# Patient Record
Sex: Female | Born: 1935 | Race: White | Hispanic: No | Smoking: Never smoker
Health system: Southern US, Community
[De-identification: ages and names within clinical notes are randomized; demographics above are authoritative.]

## PROBLEM LIST (undated history)

## (undated) DIAGNOSIS — E039 Hypothyroidism, unspecified: Secondary | ICD-10-CM

## (undated) DIAGNOSIS — R1310 Dysphagia, unspecified: Secondary | ICD-10-CM

## (undated) DIAGNOSIS — S7291XA Unspecified fracture of right femur, initial encounter for closed fracture: Secondary | ICD-10-CM

## (undated) DIAGNOSIS — D649 Anemia, unspecified: Secondary | ICD-10-CM

## (undated) DIAGNOSIS — R110 Nausea: Secondary | ICD-10-CM

## (undated) DIAGNOSIS — E079 Disorder of thyroid, unspecified: Secondary | ICD-10-CM

## (undated) DIAGNOSIS — N189 Chronic kidney disease, unspecified: Secondary | ICD-10-CM

## (undated) DIAGNOSIS — F419 Anxiety disorder, unspecified: Secondary | ICD-10-CM

## (undated) DIAGNOSIS — Z96642 Presence of left artificial hip joint: Secondary | ICD-10-CM

## (undated) DIAGNOSIS — R278 Other lack of coordination: Secondary | ICD-10-CM

## (undated) DIAGNOSIS — F0390 Unspecified dementia without behavioral disturbance: Secondary | ICD-10-CM

## (undated) DIAGNOSIS — R262 Difficulty in walking, not elsewhere classified: Secondary | ICD-10-CM

## (undated) DIAGNOSIS — E7800 Pure hypercholesterolemia, unspecified: Secondary | ICD-10-CM

## (undated) DIAGNOSIS — Z9181 History of falling: Secondary | ICD-10-CM

## (undated) DIAGNOSIS — I10 Essential (primary) hypertension: Secondary | ICD-10-CM

## (undated) DIAGNOSIS — F329 Major depressive disorder, single episode, unspecified: Secondary | ICD-10-CM

## (undated) DIAGNOSIS — R41841 Cognitive communication deficit: Secondary | ICD-10-CM

## (undated) DIAGNOSIS — M6281 Muscle weakness (generalized): Secondary | ICD-10-CM

## (undated) DIAGNOSIS — F32A Depression, unspecified: Secondary | ICD-10-CM

## (undated) HISTORY — PX: HIP SURGERY: SHX245

---

## 2008-08-10 ENCOUNTER — Emergency Department (HOSPITAL_BASED_OUTPATIENT_CLINIC_OR_DEPARTMENT_OTHER): Admission: EM | Admit: 2008-08-10 | Discharge: 2008-08-10 | Payer: Self-pay | Admitting: Emergency Medicine

## 2010-02-03 ENCOUNTER — Inpatient Hospital Stay (HOSPITAL_COMMUNITY): Admission: EM | Admit: 2010-02-03 | Discharge: 2010-02-09 | Payer: Self-pay | Admitting: Emergency Medicine

## 2010-12-06 LAB — DIFFERENTIAL
Basophils Absolute: 0 10*3/uL (ref 0.0–0.1)
Eosinophils Absolute: 0 10*3/uL (ref 0.0–0.7)
Eosinophils Relative: 0 % (ref 0–5)
Lymphs Abs: 0.8 10*3/uL (ref 0.7–4.0)
Monocytes Absolute: 0.6 10*3/uL (ref 0.1–1.0)
Monocytes Absolute: 0.7 10*3/uL (ref 0.1–1.0)
Monocytes Relative: 8 % (ref 3–12)
Neutro Abs: 6.8 10*3/uL (ref 1.7–7.7)
Neutrophils Relative %: 81 % — ABNORMAL HIGH (ref 43–77)
Neutrophils Relative %: 89 % — ABNORMAL HIGH (ref 43–77)

## 2010-12-06 LAB — BASIC METABOLIC PANEL
BUN: 14 mg/dL (ref 6–23)
CO2: 25 mEq/L (ref 19–32)
CO2: 26 mEq/L (ref 19–32)
CO2: 27 mEq/L (ref 19–32)
Calcium: 8.7 mg/dL (ref 8.4–10.5)
Calcium: 9.2 mg/dL (ref 8.4–10.5)
Chloride: 100 mEq/L (ref 96–112)
Chloride: 97 mEq/L (ref 96–112)
Creatinine, Ser: 1.05 mg/dL (ref 0.4–1.2)
GFR calc Af Amer: 50 mL/min — ABNORMAL LOW (ref >60)
GFR calc Af Amer: >60 mL/min (ref >60)
GFR calc non Af Amer: 42 mL/min — ABNORMAL LOW (ref >60)
GFR calc non Af Amer: 45 mL/min — ABNORMAL LOW (ref >60)
GFR calc non Af Amer: 50 mL/min — ABNORMAL LOW (ref >60)
GFR calc non Af Amer: 53 mL/min — ABNORMAL LOW (ref >60)
Glucose, Bld: 126 mg/dL — ABNORMAL HIGH (ref 70–99)
Glucose, Bld: 130 mg/dL — ABNORMAL HIGH (ref 70–99)
Potassium: 3.4 mEq/L — ABNORMAL LOW (ref 3.5–5.1)
Potassium: 3.7 mEq/L (ref 3.5–5.1)
Potassium: 3.8 mEq/L (ref 3.5–5.1)
Sodium: 131 mEq/L — ABNORMAL LOW (ref 135–145)
Sodium: 135 mEq/L (ref 135–145)
Sodium: 135 mEq/L (ref 135–145)
Sodium: 135 mEq/L (ref 135–145)

## 2010-12-06 LAB — CBC
HCT: 36.3 % (ref 36.0–46.0)
MCHC: 35 g/dL (ref 30.0–36.0)
Platelets: 162 10*3/uL (ref 150–400)
Platelets: 193 10*3/uL (ref 150–400)
RBC: 3.73 MIL/uL — ABNORMAL LOW (ref 3.87–5.11)
RBC: 4.02 MIL/uL (ref 3.87–5.11)
RBC: 4.35 MIL/uL (ref 3.87–5.11)
RDW: 13.3 % (ref 11.5–15.5)
WBC: 6.9 10*3/uL (ref 4.0–10.5)

## 2010-12-06 LAB — PROTIME-INR
INR: 1.06 (ref 0.00–1.49)
Prothrombin Time: 13.7 seconds (ref 11.6–15.2)

## 2010-12-06 LAB — URINALYSIS, ROUTINE W REFLEX MICROSCOPIC
Bilirubin Urine: NEGATIVE
Glucose, UA: NEGATIVE mg/dL
Hgb urine dipstick: NEGATIVE

## 2010-12-06 LAB — TYPE AND SCREEN: ABO/RH(D): O POS

## 2010-12-06 LAB — APTT
aPTT: 30 seconds (ref 24–37)
aPTT: 31 seconds (ref 24–37)

## 2010-12-06 LAB — LIPID PANEL
Cholesterol: 205 mg/dL — ABNORMAL HIGH (ref 0–200)
Total CHOL/HDL Ratio: 4.8 RATIO
Triglycerides: 130 mg/dL (ref <150)

## 2010-12-06 LAB — URINE MICROSCOPIC-ADD ON

## 2010-12-06 LAB — URINE CULTURE: Colony Count: >=100000

## 2010-12-06 LAB — POCT I-STAT, CHEM 8
BUN: 20 mg/dL (ref 6–23)
Calcium, Ion: 1.05 mmol/L — ABNORMAL LOW (ref 1.12–1.32)
Chloride: 108 mEq/L (ref 96–112)
Glucose, Bld: 145 mg/dL — ABNORMAL HIGH (ref 70–99)
HCT: 43 % (ref 36.0–46.0)
Sodium: 138 mEq/L (ref 135–145)
TCO2: 22 mmol/L (ref 0–100)

## 2011-05-29 ENCOUNTER — Emergency Department (INDEPENDENT_AMBULATORY_CARE_PROVIDER_SITE_OTHER): Payer: Medicare Other

## 2011-05-29 ENCOUNTER — Emergency Department (HOSPITAL_BASED_OUTPATIENT_CLINIC_OR_DEPARTMENT_OTHER)
Admission: EM | Admit: 2011-05-29 | Discharge: 2011-05-29 | Disposition: A | Discharge status: Home / Self Care | Payer: Medicare Other | Attending: Emergency Medicine | Admitting: Emergency Medicine

## 2011-05-29 ENCOUNTER — Encounter: Payer: Self-pay | Admitting: *Deleted

## 2011-05-29 DIAGNOSIS — W19XXXA Unspecified fall, initial encounter: Secondary | ICD-10-CM

## 2011-05-29 DIAGNOSIS — Y92009 Unspecified place in unspecified non-institutional (private) residence as the place of occurrence of the external cause: Secondary | ICD-10-CM | POA: Insufficient documentation

## 2011-05-29 DIAGNOSIS — S52509A Unspecified fracture of the lower end of unspecified radius, initial encounter for closed fracture: Secondary | ICD-10-CM

## 2011-05-29 DIAGNOSIS — S5290XA Unspecified fracture of unspecified forearm, initial encounter for closed fracture: Secondary | ICD-10-CM

## 2011-05-29 DIAGNOSIS — S52609A Unspecified fracture of lower end of unspecified ulna, initial encounter for closed fracture: Secondary | ICD-10-CM

## 2011-05-29 DIAGNOSIS — M25539 Pain in unspecified wrist: Secondary | ICD-10-CM

## 2011-05-29 DIAGNOSIS — W010XXA Fall on same level from slipping, tripping and stumbling without subsequent striking against object, initial encounter: Secondary | ICD-10-CM | POA: Insufficient documentation

## 2011-05-29 HISTORY — DX: Essential (primary) hypertension: I10

## 2011-05-29 HISTORY — DX: Depression, unspecified: F32.A

## 2011-05-29 HISTORY — DX: Anxiety disorder, unspecified: F41.9

## 2011-05-29 HISTORY — DX: Pure hypercholesterolemia, unspecified: E78.00

## 2011-05-29 HISTORY — DX: Disorder of thyroid, unspecified: E07.9

## 2011-05-29 HISTORY — DX: Major depressive disorder, single episode, unspecified: F32.9

## 2011-05-29 MED ORDER — HYDROCODONE-ACETAMINOPHEN 5-325 MG PO TABS
ORAL_TABLET | ORAL | Status: AC
  Administered 2011-05-29: 1
  Filled 2011-05-29: qty 1

## 2011-05-29 MED ORDER — HYDROCODONE-ACETAMINOPHEN 5-325 MG PO TABS: 2.0000 | ORAL_TABLET | ORAL | Status: AC | PRN

## 2011-05-29 NOTE — ED Notes (Signed)
Pt states she tripped over the cat about 2 hours ago. C/O pain to left wrist

## 2011-05-29 NOTE — ED Notes (Signed)
Called for the on call MD for Dr. Charlann Boxer, [facility]; Dr. Rennis Chris is on call.

## 2011-05-29 NOTE — ED Notes (Signed)
Pt d/c home with family- rx x 1 for hydrocodone

## 2011-05-29 NOTE — ED Provider Notes (Signed)
History     CSN: [id number] Arrival date & time: 05/29/2011  8:28 PM  Chief Complaint  Patient presents with  . Wrist Pain   Patient is a 77 y.o. female presenting with wrist pain. The history is provided by the patient.  Wrist Pain This is a new problem. The current episode started today. The problem occurs constantly. The problem has been unchanged. Associated symptoms include joint swelling. The symptoms are aggravated by nothing. She has tried nothing for the symptoms. The treatment provided moderate relief.  Pt complains of pain in left wrist.  Pt reports she tripped over her cat and fell onto her wrist.   Past Medical History  Diagnosis Date  . Hypertension   . Hypercholesteremia   . Thyroid disease   . Anxiety   . Depression     Past Surgical History  Procedure Date  . Hip surgery     History reviewed. No pertinent family history.  History  Substance Use Topics  . Smoking status: Never Smoker   . Smokeless tobacco: Not on file  . Alcohol Use: No    OB History    Grav Para Term Preterm Abortions TAB SAB Ect Mult Living                  Review of Systems  Musculoskeletal: Positive for joint swelling.  Skin: Negative.   All other systems reviewed and are negative.    Physical Exam  BP 169/72  Pulse 80  Temp(Src) 98.4 F (36.9 C) (Oral)  Resp 18  Ht 5' 5.5" (1.664 m)  Wt 146 lb (66.225 kg)  BMI 23.93 kg/m2  SpO2 99%  Physical Exam  Nursing note and vitals reviewed. Constitutional: She is oriented to person, place, and time. She appears well-developed and well-nourished.  HENT:  Head: Normocephalic.  Eyes: Pupils are equal, round, and reactive to light.  Neck: Normal range of motion.  Musculoskeletal:       Swollen tender left wrist,  Decreased range of motion,  nv and ns intact  Superficial abrasion left leg,  Neurological: She is alert and oriented to person, place, and time.  Skin: Skin is warm and dry.  Psychiatric: She has a normal mood and  affect.    ED Course  Procedures  MDM Fx distal radius and ulna styloid.  I spoke to Dr. Rennis Chris who advised to have pt call office in am to be seen there tomorrow for evaluation,      Langston Masker, [place] 05/29/11 2205

## 2011-05-30 NOTE — ED Provider Notes (Signed)
Medical screening examination/treatment/procedure(s) were performed by non-physician practitioner and as supervising physician I was immediately available for consultation/collaboration.   Leigh-Ann Cathie Bonnell, MD 05/30/11 0148 

## 2011-06-22 LAB — URINE CULTURE
Colony Count: NO GROWTH
Culture: NO GROWTH

## 2011-06-22 LAB — URINALYSIS, ROUTINE W REFLEX MICROSCOPIC
Bilirubin Urine: NEGATIVE
Ketones, ur: 40 — AB
Nitrite: NEGATIVE
Protein, ur: 100 — AB
Specific Gravity, Urine: 1.017
Urobilinogen, UA: 1

## 2011-09-24 DIAGNOSIS — Z23 Encounter for immunization: Secondary | ICD-10-CM | POA: Diagnosis not present

## 2011-12-14 DIAGNOSIS — E039 Hypothyroidism, unspecified: Secondary | ICD-10-CM | POA: Diagnosis not present

## 2011-12-14 DIAGNOSIS — E559 Vitamin D deficiency, unspecified: Secondary | ICD-10-CM | POA: Diagnosis not present

## 2011-12-14 DIAGNOSIS — R7301 Impaired fasting glucose: Secondary | ICD-10-CM | POA: Diagnosis not present

## 2011-12-14 DIAGNOSIS — E785 Hyperlipidemia, unspecified: Secondary | ICD-10-CM | POA: Diagnosis not present

## 2011-12-14 DIAGNOSIS — I10 Essential (primary) hypertension: Secondary | ICD-10-CM | POA: Diagnosis not present

## 2011-12-23 DIAGNOSIS — E559 Vitamin D deficiency, unspecified: Secondary | ICD-10-CM | POA: Diagnosis not present

## 2011-12-23 DIAGNOSIS — M810 Age-related osteoporosis without current pathological fracture: Secondary | ICD-10-CM | POA: Diagnosis not present

## 2011-12-23 DIAGNOSIS — E039 Hypothyroidism, unspecified: Secondary | ICD-10-CM | POA: Diagnosis not present

## 2011-12-23 DIAGNOSIS — E785 Hyperlipidemia, unspecified: Secondary | ICD-10-CM | POA: Diagnosis not present

## 2011-12-23 DIAGNOSIS — R7301 Impaired fasting glucose: Secondary | ICD-10-CM | POA: Diagnosis not present

## 2011-12-23 DIAGNOSIS — I10 Essential (primary) hypertension: Secondary | ICD-10-CM | POA: Diagnosis not present

## 2012-03-27 DIAGNOSIS — E039 Hypothyroidism, unspecified: Secondary | ICD-10-CM | POA: Diagnosis not present

## 2012-03-27 DIAGNOSIS — E559 Vitamin D deficiency, unspecified: Secondary | ICD-10-CM | POA: Diagnosis not present

## 2012-03-27 DIAGNOSIS — I10 Essential (primary) hypertension: Secondary | ICD-10-CM | POA: Diagnosis not present

## 2012-03-27 DIAGNOSIS — E785 Hyperlipidemia, unspecified: Secondary | ICD-10-CM | POA: Diagnosis not present

## 2012-03-27 DIAGNOSIS — R7301 Impaired fasting glucose: Secondary | ICD-10-CM | POA: Diagnosis not present

## 2012-04-05 DIAGNOSIS — E039 Hypothyroidism, unspecified: Secondary | ICD-10-CM | POA: Diagnosis not present

## 2012-04-05 DIAGNOSIS — E559 Vitamin D deficiency, unspecified: Secondary | ICD-10-CM | POA: Diagnosis not present

## 2012-04-05 DIAGNOSIS — R7301 Impaired fasting glucose: Secondary | ICD-10-CM | POA: Diagnosis not present

## 2012-04-05 DIAGNOSIS — I10 Essential (primary) hypertension: Secondary | ICD-10-CM | POA: Diagnosis not present

## 2012-04-05 DIAGNOSIS — M810 Age-related osteoporosis without current pathological fracture: Secondary | ICD-10-CM | POA: Diagnosis not present

## 2012-04-05 DIAGNOSIS — E785 Hyperlipidemia, unspecified: Secondary | ICD-10-CM | POA: Diagnosis not present

## 2012-06-15 DIAGNOSIS — E559 Vitamin D deficiency, unspecified: Secondary | ICD-10-CM | POA: Diagnosis not present

## 2012-06-15 DIAGNOSIS — R7301 Impaired fasting glucose: Secondary | ICD-10-CM | POA: Diagnosis not present

## 2012-06-15 DIAGNOSIS — E039 Hypothyroidism, unspecified: Secondary | ICD-10-CM | POA: Diagnosis not present

## 2012-06-15 DIAGNOSIS — E785 Hyperlipidemia, unspecified: Secondary | ICD-10-CM | POA: Diagnosis not present

## 2012-06-15 DIAGNOSIS — I10 Essential (primary) hypertension: Secondary | ICD-10-CM | POA: Diagnosis not present

## 2012-06-15 DIAGNOSIS — M810 Age-related osteoporosis without current pathological fracture: Secondary | ICD-10-CM | POA: Diagnosis not present

## 2012-06-28 DIAGNOSIS — E785 Hyperlipidemia, unspecified: Secondary | ICD-10-CM | POA: Diagnosis not present

## 2012-06-28 DIAGNOSIS — Z23 Encounter for immunization: Secondary | ICD-10-CM | POA: Diagnosis not present

## 2012-06-28 DIAGNOSIS — E559 Vitamin D deficiency, unspecified: Secondary | ICD-10-CM | POA: Diagnosis not present

## 2012-06-28 DIAGNOSIS — R7301 Impaired fasting glucose: Secondary | ICD-10-CM | POA: Diagnosis not present

## 2012-06-28 DIAGNOSIS — M810 Age-related osteoporosis without current pathological fracture: Secondary | ICD-10-CM | POA: Diagnosis not present

## 2012-06-28 DIAGNOSIS — E039 Hypothyroidism, unspecified: Secondary | ICD-10-CM | POA: Diagnosis not present

## 2012-06-28 DIAGNOSIS — I10 Essential (primary) hypertension: Secondary | ICD-10-CM | POA: Diagnosis not present

## 2012-11-14 DIAGNOSIS — I10 Essential (primary) hypertension: Secondary | ICD-10-CM | POA: Diagnosis not present

## 2012-11-14 DIAGNOSIS — E039 Hypothyroidism, unspecified: Secondary | ICD-10-CM | POA: Diagnosis not present

## 2012-11-14 DIAGNOSIS — R7301 Impaired fasting glucose: Secondary | ICD-10-CM | POA: Diagnosis not present

## 2012-11-14 DIAGNOSIS — E559 Vitamin D deficiency, unspecified: Secondary | ICD-10-CM | POA: Diagnosis not present

## 2012-11-15 DIAGNOSIS — M810 Age-related osteoporosis without current pathological fracture: Secondary | ICD-10-CM | POA: Diagnosis not present

## 2012-11-15 DIAGNOSIS — E785 Hyperlipidemia, unspecified: Secondary | ICD-10-CM | POA: Diagnosis not present

## 2012-11-15 DIAGNOSIS — R7301 Impaired fasting glucose: Secondary | ICD-10-CM | POA: Diagnosis not present

## 2012-11-15 DIAGNOSIS — E039 Hypothyroidism, unspecified: Secondary | ICD-10-CM | POA: Diagnosis not present

## 2013-03-26 DIAGNOSIS — E039 Hypothyroidism, unspecified: Secondary | ICD-10-CM | POA: Diagnosis not present

## 2013-03-26 DIAGNOSIS — E785 Hyperlipidemia, unspecified: Secondary | ICD-10-CM | POA: Diagnosis not present

## 2013-03-26 DIAGNOSIS — R7301 Impaired fasting glucose: Secondary | ICD-10-CM | POA: Diagnosis not present

## 2013-03-26 DIAGNOSIS — E559 Vitamin D deficiency, unspecified: Secondary | ICD-10-CM | POA: Diagnosis not present

## 2013-03-26 DIAGNOSIS — I10 Essential (primary) hypertension: Secondary | ICD-10-CM | POA: Diagnosis not present

## 2013-03-27 DIAGNOSIS — E039 Hypothyroidism, unspecified: Secondary | ICD-10-CM | POA: Diagnosis not present

## 2013-03-27 DIAGNOSIS — I10 Essential (primary) hypertension: Secondary | ICD-10-CM | POA: Diagnosis not present

## 2013-03-27 DIAGNOSIS — E785 Hyperlipidemia, unspecified: Secondary | ICD-10-CM | POA: Diagnosis not present

## 2013-03-27 DIAGNOSIS — M810 Age-related osteoporosis without current pathological fracture: Secondary | ICD-10-CM | POA: Diagnosis not present

## 2013-03-27 DIAGNOSIS — R7301 Impaired fasting glucose: Secondary | ICD-10-CM | POA: Diagnosis not present

## 2013-03-27 DIAGNOSIS — E559 Vitamin D deficiency, unspecified: Secondary | ICD-10-CM | POA: Diagnosis not present

## 2013-08-13 DIAGNOSIS — Z23 Encounter for immunization: Secondary | ICD-10-CM | POA: Diagnosis not present

## 2013-08-27 DIAGNOSIS — E559 Vitamin D deficiency, unspecified: Secondary | ICD-10-CM | POA: Diagnosis not present

## 2013-08-27 DIAGNOSIS — E039 Hypothyroidism, unspecified: Secondary | ICD-10-CM | POA: Diagnosis not present

## 2013-08-27 DIAGNOSIS — R7301 Impaired fasting glucose: Secondary | ICD-10-CM | POA: Diagnosis not present

## 2013-08-27 DIAGNOSIS — E785 Hyperlipidemia, unspecified: Secondary | ICD-10-CM | POA: Diagnosis not present

## 2013-08-29 DIAGNOSIS — E039 Hypothyroidism, unspecified: Secondary | ICD-10-CM | POA: Diagnosis not present

## 2013-08-29 DIAGNOSIS — E785 Hyperlipidemia, unspecified: Secondary | ICD-10-CM | POA: Diagnosis not present

## 2013-08-29 DIAGNOSIS — I10 Essential (primary) hypertension: Secondary | ICD-10-CM | POA: Diagnosis not present

## 2013-08-29 DIAGNOSIS — M810 Age-related osteoporosis without current pathological fracture: Secondary | ICD-10-CM | POA: Diagnosis not present

## 2013-08-29 DIAGNOSIS — E559 Vitamin D deficiency, unspecified: Secondary | ICD-10-CM | POA: Diagnosis not present

## 2013-08-29 DIAGNOSIS — R7301 Impaired fasting glucose: Secondary | ICD-10-CM | POA: Diagnosis not present

## 2014-03-03 DIAGNOSIS — R7301 Impaired fasting glucose: Secondary | ICD-10-CM | POA: Diagnosis not present

## 2014-03-03 DIAGNOSIS — I10 Essential (primary) hypertension: Secondary | ICD-10-CM | POA: Diagnosis not present

## 2014-03-03 DIAGNOSIS — E785 Hyperlipidemia, unspecified: Secondary | ICD-10-CM | POA: Diagnosis not present

## 2014-03-03 DIAGNOSIS — E039 Hypothyroidism, unspecified: Secondary | ICD-10-CM | POA: Diagnosis not present

## 2014-03-06 DIAGNOSIS — E785 Hyperlipidemia, unspecified: Secondary | ICD-10-CM | POA: Diagnosis not present

## 2014-03-06 DIAGNOSIS — E559 Vitamin D deficiency, unspecified: Secondary | ICD-10-CM | POA: Diagnosis not present

## 2014-03-06 DIAGNOSIS — I10 Essential (primary) hypertension: Secondary | ICD-10-CM | POA: Diagnosis not present

## 2014-03-06 DIAGNOSIS — M810 Age-related osteoporosis without current pathological fracture: Secondary | ICD-10-CM | POA: Diagnosis not present

## 2014-03-06 DIAGNOSIS — E039 Hypothyroidism, unspecified: Secondary | ICD-10-CM | POA: Diagnosis not present

## 2014-03-06 DIAGNOSIS — R7301 Impaired fasting glucose: Secondary | ICD-10-CM | POA: Diagnosis not present

## 2014-08-19 DIAGNOSIS — Z23 Encounter for immunization: Secondary | ICD-10-CM | POA: Diagnosis not present

## 2014-10-28 DIAGNOSIS — E784 Other hyperlipidemia: Secondary | ICD-10-CM | POA: Diagnosis not present

## 2014-10-28 DIAGNOSIS — E039 Hypothyroidism, unspecified: Secondary | ICD-10-CM | POA: Diagnosis not present

## 2014-10-28 DIAGNOSIS — I10 Essential (primary) hypertension: Secondary | ICD-10-CM | POA: Diagnosis not present

## 2014-10-28 DIAGNOSIS — R7301 Impaired fasting glucose: Secondary | ICD-10-CM | POA: Diagnosis not present

## 2014-10-30 DIAGNOSIS — E784 Other hyperlipidemia: Secondary | ICD-10-CM | POA: Diagnosis not present

## 2014-10-30 DIAGNOSIS — E038 Other specified hypothyroidism: Secondary | ICD-10-CM | POA: Diagnosis not present

## 2014-10-30 DIAGNOSIS — R7301 Impaired fasting glucose: Secondary | ICD-10-CM | POA: Diagnosis not present

## 2014-10-30 DIAGNOSIS — E039 Hypothyroidism, unspecified: Secondary | ICD-10-CM | POA: Diagnosis not present

## 2014-10-30 DIAGNOSIS — E559 Vitamin D deficiency, unspecified: Secondary | ICD-10-CM | POA: Diagnosis not present

## 2014-10-30 DIAGNOSIS — M810 Age-related osteoporosis without current pathological fracture: Secondary | ICD-10-CM | POA: Diagnosis not present

## 2014-10-30 DIAGNOSIS — I10 Essential (primary) hypertension: Secondary | ICD-10-CM | POA: Diagnosis not present

## 2014-10-30 DIAGNOSIS — E782 Mixed hyperlipidemia: Secondary | ICD-10-CM | POA: Diagnosis not present

## 2015-07-17 DIAGNOSIS — Z23 Encounter for immunization: Secondary | ICD-10-CM | POA: Diagnosis not present

## 2015-09-03 DIAGNOSIS — I10 Essential (primary) hypertension: Secondary | ICD-10-CM | POA: Diagnosis not present

## 2015-09-03 DIAGNOSIS — E038 Other specified hypothyroidism: Secondary | ICD-10-CM | POA: Diagnosis not present

## 2015-09-03 DIAGNOSIS — R7301 Impaired fasting glucose: Secondary | ICD-10-CM | POA: Diagnosis not present

## 2015-09-03 DIAGNOSIS — E782 Mixed hyperlipidemia: Secondary | ICD-10-CM | POA: Diagnosis not present

## 2015-09-07 DIAGNOSIS — E782 Mixed hyperlipidemia: Secondary | ICD-10-CM | POA: Diagnosis not present

## 2015-09-07 DIAGNOSIS — M810 Age-related osteoporosis without current pathological fracture: Secondary | ICD-10-CM | POA: Diagnosis not present

## 2015-09-07 DIAGNOSIS — E784 Other hyperlipidemia: Secondary | ICD-10-CM | POA: Diagnosis not present

## 2015-09-07 DIAGNOSIS — I10 Essential (primary) hypertension: Secondary | ICD-10-CM | POA: Diagnosis not present

## 2015-09-07 DIAGNOSIS — E559 Vitamin D deficiency, unspecified: Secondary | ICD-10-CM | POA: Diagnosis not present

## 2015-09-07 DIAGNOSIS — E038 Other specified hypothyroidism: Secondary | ICD-10-CM | POA: Diagnosis not present

## 2015-09-07 DIAGNOSIS — R7301 Impaired fasting glucose: Secondary | ICD-10-CM | POA: Diagnosis not present

## 2015-09-07 DIAGNOSIS — E039 Hypothyroidism, unspecified: Secondary | ICD-10-CM | POA: Diagnosis not present

## 2015-11-08 ENCOUNTER — Emergency Department (HOSPITAL_COMMUNITY): Payer: Medicare Other

## 2015-11-08 ENCOUNTER — Encounter (HOSPITAL_COMMUNITY): Payer: Self-pay | Admitting: Emergency Medicine

## 2015-11-08 ENCOUNTER — Inpatient Hospital Stay (HOSPITAL_COMMUNITY)
Admission: EM | Admit: 2015-11-08 | Discharge: 2015-11-11 | DRG: 481 | Disposition: A | Discharge status: Skilled Nursing Facility | Payer: Medicare Other | Attending: Internal Medicine | Admitting: Internal Medicine

## 2015-11-08 DIAGNOSIS — S72001D Fracture of unspecified part of neck of right femur, subsequent encounter for closed fracture with routine healing: Secondary | ICD-10-CM | POA: Diagnosis not present

## 2015-11-08 DIAGNOSIS — R110 Nausea: Secondary | ICD-10-CM | POA: Diagnosis not present

## 2015-11-08 DIAGNOSIS — M25551 Pain in right hip: Secondary | ICD-10-CM | POA: Diagnosis not present

## 2015-11-08 DIAGNOSIS — F329 Major depressive disorder, single episode, unspecified: Secondary | ICD-10-CM | POA: Diagnosis present

## 2015-11-08 DIAGNOSIS — E039 Hypothyroidism, unspecified: Secondary | ICD-10-CM | POA: Diagnosis present

## 2015-11-08 DIAGNOSIS — I4510 Unspecified right bundle-branch block: Secondary | ICD-10-CM | POA: Diagnosis not present

## 2015-11-08 DIAGNOSIS — R413 Other amnesia: Secondary | ICD-10-CM | POA: Diagnosis present

## 2015-11-08 DIAGNOSIS — D649 Anemia, unspecified: Secondary | ICD-10-CM | POA: Diagnosis not present

## 2015-11-08 DIAGNOSIS — R262 Difficulty in walking, not elsewhere classified: Secondary | ICD-10-CM | POA: Diagnosis not present

## 2015-11-08 DIAGNOSIS — F0281 Dementia in other diseases classified elsewhere with behavioral disturbance: Secondary | ICD-10-CM | POA: Diagnosis not present

## 2015-11-08 DIAGNOSIS — W010XXA Fall on same level from slipping, tripping and stumbling without subsequent striking against object, initial encounter: Secondary | ICD-10-CM | POA: Diagnosis present

## 2015-11-08 DIAGNOSIS — Z01818 Encounter for other preprocedural examination: Secondary | ICD-10-CM | POA: Diagnosis not present

## 2015-11-08 DIAGNOSIS — N183 Chronic kidney disease, stage 3 (moderate): Secondary | ICD-10-CM | POA: Diagnosis not present

## 2015-11-08 DIAGNOSIS — Z01811 Encounter for preprocedural respiratory examination: Secondary | ICD-10-CM

## 2015-11-08 DIAGNOSIS — R1312 Dysphagia, oropharyngeal phase: Secondary | ICD-10-CM | POA: Diagnosis not present

## 2015-11-08 DIAGNOSIS — S72009A Fracture of unspecified part of neck of unspecified femur, initial encounter for closed fracture: Secondary | ICD-10-CM | POA: Diagnosis not present

## 2015-11-08 DIAGNOSIS — E871 Hypo-osmolality and hyponatremia: Secondary | ICD-10-CM | POA: Diagnosis present

## 2015-11-08 DIAGNOSIS — F419 Anxiety disorder, unspecified: Secondary | ICD-10-CM | POA: Diagnosis present

## 2015-11-08 DIAGNOSIS — R41841 Cognitive communication deficit: Secondary | ICD-10-CM | POA: Diagnosis not present

## 2015-11-08 DIAGNOSIS — R52 Pain, unspecified: Secondary | ICD-10-CM | POA: Diagnosis not present

## 2015-11-08 DIAGNOSIS — S72001A Fracture of unspecified part of neck of right femur, initial encounter for closed fracture: Secondary | ICD-10-CM

## 2015-11-08 DIAGNOSIS — E7800 Pure hypercholesterolemia, unspecified: Secondary | ICD-10-CM | POA: Diagnosis present

## 2015-11-08 DIAGNOSIS — R278 Other lack of coordination: Secondary | ICD-10-CM | POA: Diagnosis not present

## 2015-11-08 DIAGNOSIS — F0390 Unspecified dementia without behavioral disturbance: Secondary | ICD-10-CM | POA: Diagnosis present

## 2015-11-08 DIAGNOSIS — Z9181 History of falling: Secondary | ICD-10-CM | POA: Diagnosis not present

## 2015-11-08 DIAGNOSIS — Z96642 Presence of left artificial hip joint: Secondary | ICD-10-CM | POA: Diagnosis not present

## 2015-11-08 DIAGNOSIS — N179 Acute kidney failure, unspecified: Secondary | ICD-10-CM | POA: Diagnosis not present

## 2015-11-08 DIAGNOSIS — S72041A Displaced fracture of base of neck of right femur, initial encounter for closed fracture: Secondary | ICD-10-CM | POA: Diagnosis not present

## 2015-11-08 DIAGNOSIS — I10 Essential (primary) hypertension: Secondary | ICD-10-CM | POA: Diagnosis present

## 2015-11-08 DIAGNOSIS — T148 Other injury of unspecified body region: Secondary | ICD-10-CM | POA: Diagnosis not present

## 2015-11-08 DIAGNOSIS — S72011A Unspecified intracapsular fracture of right femur, initial encounter for closed fracture: Principal | ICD-10-CM | POA: Diagnosis present

## 2015-11-08 DIAGNOSIS — E785 Hyperlipidemia, unspecified: Secondary | ICD-10-CM | POA: Diagnosis present

## 2015-11-08 DIAGNOSIS — M8580 Other specified disorders of bone density and structure, unspecified site: Secondary | ICD-10-CM | POA: Diagnosis present

## 2015-11-08 DIAGNOSIS — Z419 Encounter for procedure for purposes other than remedying health state, unspecified: Secondary | ICD-10-CM

## 2015-11-08 DIAGNOSIS — M6281 Muscle weakness (generalized): Secondary | ICD-10-CM | POA: Diagnosis not present

## 2015-11-08 DIAGNOSIS — W19XXXA Unspecified fall, initial encounter: Secondary | ICD-10-CM | POA: Diagnosis not present

## 2015-11-08 LAB — BASIC METABOLIC PANEL
ANION GAP: 12 (ref 5–15)
BUN: 15 mg/dL (ref 6–20)
CHLORIDE: 97 mmol/L — AB (ref 101–111)
CO2: 20 mmol/L — AB (ref 22–32)
Calcium: 8.8 mg/dL — ABNORMAL LOW (ref 8.9–10.3)
Creatinine, Ser: 1.46 mg/dL — ABNORMAL HIGH (ref 0.44–1.00)
GFR calc Af Amer: 36 mL/min — ABNORMAL LOW (ref >60)
GFR, EST NON AFRICAN AMERICAN: 31 mL/min — AB (ref >60)
GLUCOSE: 129 mg/dL — AB (ref 65–99)
POTASSIUM: 4.2 mmol/L (ref 3.5–5.1)
Sodium: 129 mmol/L — ABNORMAL LOW (ref 135–145)

## 2015-11-08 LAB — PROTIME-INR
INR: 1.14 (ref 0.00–1.49)
Prothrombin Time: 14.8 seconds (ref 11.6–15.2)

## 2015-11-08 LAB — CBC WITH DIFFERENTIAL/PLATELET
BASOS ABS: 0 10*3/uL (ref 0.0–0.1)
Basophils Relative: 0 %
Eosinophils Absolute: 0 10*3/uL (ref 0.0–0.7)
Eosinophils Relative: 0 %
HEMATOCRIT: 36.5 % (ref 36.0–46.0)
Hemoglobin: 11.6 g/dL — ABNORMAL LOW (ref 12.0–15.0)
LYMPHS ABS: 0.7 10*3/uL (ref 0.7–4.0)
LYMPHS PCT: 7 %
MCH: 28.6 pg (ref 26.0–34.0)
MCHC: 31.8 g/dL (ref 30.0–36.0)
MCV: 89.9 fL (ref 78.0–100.0)
MONO ABS: 0.7 10*3/uL (ref 0.1–1.0)
Monocytes Relative: 7 %
NEUTROS ABS: 8.4 10*3/uL — AB (ref 1.7–7.7)
Neutrophils Relative %: 86 %
Platelets: 226 10*3/uL (ref 150–400)
RBC: 4.06 MIL/uL (ref 3.87–5.11)
RDW: 13.2 % (ref 11.5–15.5)
WBC: 9.9 10*3/uL (ref 4.0–10.5)

## 2015-11-08 LAB — TYPE AND SCREEN
ABO/RH(D): O POS
ANTIBODY SCREEN: NEGATIVE

## 2015-11-08 MED ORDER — HYDRALAZINE HCL 20 MG/ML IJ SOLN: 10.0000 mg | INTRAMUSCULAR | Status: DC | PRN

## 2015-11-08 MED ORDER — ATENOLOL 50 MG PO TABS
50.0000 mg | ORAL_TABLET | Freq: Every day | ORAL | Status: DC
  Administered 2015-11-10 – 2015-11-11 (×2): 50 mg via ORAL
  Filled 2015-11-08 (×3): qty 1

## 2015-11-08 MED ORDER — MORPHINE SULFATE (PF) 2 MG/ML IV SOLN
0.5000 mg | INTRAVENOUS | Status: DC | PRN
  Administered 2015-11-09 (×2): 0.5 mg via INTRAVENOUS
  Filled 2015-11-08 (×2): qty 1

## 2015-11-08 MED ORDER — MORPHINE SULFATE (PF) 4 MG/ML IV SOLN
4.0000 mg | INTRAVENOUS | Status: DC | PRN
  Administered 2015-11-08 (×2): 4 mg via INTRAVENOUS
  Filled 2015-11-08 (×2): qty 1

## 2015-11-08 MED ORDER — ONDANSETRON HCL 4 MG/2ML IJ SOLN
4.0000 mg | Freq: Once | INTRAMUSCULAR | Status: AC
  Administered 2015-11-08: 4 mg via INTRAVENOUS
  Filled 2015-11-08: qty 2

## 2015-11-08 MED ORDER — ROSUVASTATIN CALCIUM 10 MG PO TABS
10.0000 mg | ORAL_TABLET | Freq: Every day | ORAL | Status: DC
  Administered 2015-11-10 – 2015-11-11 (×2): 10 mg via ORAL
  Filled 2015-11-08 (×2): qty 1

## 2015-11-08 MED ORDER — SODIUM CHLORIDE 0.9 % IV SOLN
INTRAVENOUS | Status: AC
  Administered 2015-11-08: 1000 mL via INTRAVENOUS

## 2015-11-08 MED ORDER — ESCITALOPRAM OXALATE 10 MG PO TABS
10.0000 mg | ORAL_TABLET | Freq: Every day | ORAL | Status: DC
  Administered 2015-11-10 – 2015-11-11 (×2): 10 mg via ORAL
  Filled 2015-11-08 (×2): qty 1

## 2015-11-08 MED ORDER — LEVOTHYROXINE SODIUM 100 MCG PO TABS
100.0000 ug | ORAL_TABLET | Freq: Every day | ORAL | Status: DC
  Administered 2015-11-10 – 2015-11-11 (×2): 100 ug via ORAL
  Filled 2015-11-08 (×2): qty 1

## 2015-11-08 MED ORDER — HYDROCODONE-ACETAMINOPHEN 5-325 MG PO TABS
1.0000 | ORAL_TABLET | Freq: Four times a day (QID) | ORAL | Status: DC | PRN
  Administered 2015-11-09 – 2015-11-11 (×2): 2 via ORAL
  Filled 2015-11-08 (×2): qty 2

## 2015-11-08 NOTE — H&P (Signed)
[facility] History and Physical  Natasha Beasley AVW:[id number] DOB: 01-16-1925 DOA: 11/08/2015  Referring physician: Beatris Ship. PCP: Junious Silk, MD  Specialists: None.  Chief Complaint: Fall.  HPI: Natasha Beasley is a 82 y.o. female with history of hypertension, hypothyroidism, hyperlipidemia had a fall while at the church. As per patient's son patient tripped on the mat and fell. Denies losing consciousness or hitting her head. X-rays reveal right hip fracture and Dr. Rennis Chris was consulted and patient will be scheduled for surgery in a.m. by Dr. Charlann Boxer. Patient denies any chest pain shortness of breath nausea vomiting abdominal pain headache.   Review of Systems: As presented in the history of presenting illness, rest negative.  Past Medical History  Diagnosis Date  . Hypertension   . Hypercholesteremia   . Thyroid disease   . Anxiety   . Depression    Past Surgical History  Procedure Laterality Date  . Hip surgery     Social History:  reports that she has never smoked. She does not have any smokeless tobacco history on file. She reports that she does not drink alcohol or use illicit drugs. Where does patient live home. Can patient participate in ADLs? Yes.  No Known Allergies  Family History:  Family History  Problem Relation Age of Onset  . Hypertension Son       Prior to Admission medications   Medication Sig Start Date End Date Taking? Authorizing Provider  atenolol (TENORMIN) 50 MG tablet Take 50 mg by mouth daily.     Yes Historical Provider, MD  ergocalciferol (VITAMIN D2) 50000 UNITS capsule Take 50,000 Units by mouth every 14 (fourteen) days. Every other sunday   Yes Historical Provider, MD  escitalopram (LEXAPRO) 10 MG tablet Take 10 mg by mouth daily.     Yes Historical Provider, MD  hydrochlorothiazide 25 MG tablet Take 25 mg by mouth daily.     Yes Historical Provider, MD  levothyroxine (SYNTHROID, LEVOTHROID) 100 MCG tablet Take 100 mcg by  mouth daily. On an empty stomach    Yes Historical Provider, MD  losartan (COZAAR) 25 MG tablet Take 25 mg by mouth daily.   Yes Historical Provider, MD  ondansetron (ZOFRAN) 4 MG tablet Take 4 mg by mouth every 8 (eight) hours as needed for nausea or vomiting. Reported on 11/08/2015   Yes Historical Provider, MD  rosuvastatin (CRESTOR) 10 MG tablet Take 10 mg by mouth daily.     Yes Historical Provider, MD    Physical Exam: Filed Vitals:   11/08/15 2013 11/08/15 2030 11/08/15 2125 11/08/15 2130  BP: 159/106 138/113 143/65 128/76  Pulse:  74 75 73  Temp: 97.7 F (36.5 C)  98 F (36.7 C)   TempSrc: Oral  Oral   Resp: Weight:      SpO2: 92% 88% 94% 89%     General:  Moderately built and nourished.  Eyes: Anicteric no pallor.  ENT: No discharge from the ears eyes nose mouth.  Neck: No neck rigidity.  Cardiovascular: S1 and S2 heard.  Respiratory: No rhonchi or crepitations.  Abdomen: Soft nontender bowel sounds present. No guarding or rigidity.  Skin: No rash.  Musculoskeletal: Pain on moving the right hip.  Psychiatric: Appears normal.  Neurologic: Alert awake oriented to time place and person. Moves all extremities.  Labs on Admission:  Basic Metabolic Panel:  Recent Labs Lab 11/08/15 1908  NA 129*  K 4.2  CL 97*  CO2 20*  GLUCOSE 129*  BUN 15  CREATININE 1.46*  CALCIUM 8.8*   Liver Function Tests: No results for input(s): AST, ALT, ALKPHOS, BILITOT, PROT, ALBUMIN in the last 168 hours. No results for input(s): LIPASE, AMYLASE in the last 168 hours. No results for input(s): AMMONIA in the last 168 hours. CBC:  Recent Labs Lab 11/08/15 1908  WBC 9.9  NEUTROABS 8.4*  HGB 11.6*  HCT 36.5  MCV 89.9  PLT 226   Cardiac Enzymes: No results for input(s): CKTOTAL, CKMB, CKMBINDEX, TROPONINI in the last 168 hours.  BNP (last 3 results) No results for input(s): BNP in the last 8760 hours.  ProBNP (last 3 results) No results for  input(s): PROBNP in the last 8760 hours.  CBG: No results for input(s): GLUCAP in the last 168 hours.  Radiological Exams on Admission: Dg Chest Port 1 View  11/08/2015  CLINICAL DATA:  Pre-op chest exam; right hip fracture; h/o HTN EXAM: PORTABLE CHEST 1 VIEW COMPARISON:  02/03/2010 FINDINGS: Lungs are hyperinflated. There is perihilar peribronchial thickening. There are no focal consolidations or pleural effusions. No pulmonary edema. IMPRESSION: Bronchitic changes. Electronically Signed   By: Norva Pavlov M.D.   On: 11/08/2015 17:50   Dg Hip Unilat With Pelvis 2-3 Views Right  11/08/2015  CLINICAL DATA:  Status post trip and fall today with a right hip injury. Pain. Initial encounter. EXAM: DG HIP (WITH OR WITHOUT PELVIS) 2-3V RIGHT COMPARISON:  None. FINDINGS: There is an acute mildly impacted subcapital fracture of the right hip. No other acute bony or joint abnormality is identified. Left hip replacement is noted. Bones are osteopenic. IMPRESSION: Acute mildly impacted subcapital fracture right hip. Osteopenia. Electronically Signed   By: Drusilla Kanner M.D.   On: 11/08/2015 16:25    EKG: Independently reviewed. Normal sinus rhythm with RBBB. QTC within acceptable limits.  Assessment/Plan Principal Problem:   Closed right hip fracture (HCC) Active Problems:   ARF (acute renal failure) (HCC)   Hypertension   HLD (hyperlipidemia)   Hypothyroidism   Hip fracture (HCC)   1. Right hip fracture status post mechanical fall - phone medical standpoint of view patient is at moderate risk for intermediate risk procedure. Patient will be kept nothing by mouth past midnight in anticipation of surgery. 2. Acute renal failure with hyponatremia - we'll hold off Cozaar and hydrochlorothiazide and gently hydrate. Check UA. Closely follow intake output and metabolic panel. 3. Hypertension - since Cozaar and hydrochlorothiazide on hold I have placed patient on when necessary IV hydralazine along  with patient's home dose of beta blocker. 4. Hyperlipidemia on statins. 5. Hypothyroidism on Synthroid. 6. Mild anemia - follow CBC.   DVT Prophylaxis SCDs in anticipation of surgery.  Code Status: Full code.  Family Communication: Patient's son.  Disposition Plan: Admit to inpatient.    Odalys Win N. [facility] Pager [phone].  If 7PM-7AM, please contact night-coverage [email or web address] Password [facility] 11/08/2015, 10:18 PM

## 2015-11-08 NOTE — Consult Note (Signed)
Reason for Consult:Right hip pain Referring Physician: EDP  HPI: Natasha Beasley is an 82 y.o. female who presents to the emergency department after a fall while at church today. She states that she fell and tripped over a rug and injured her right hip.Prior to fall living independently according to family present at bedside. Family reports moderate short term memory loss. Well known to [facility] after left hip fracture treated by Dr. Charlann Boxer in 2011  Past Medical History  Diagnosis Date  . Hypertension   . Hypercholesteremia   . Thyroid disease   . Anxiety   . Depression     Past Surgical History  Procedure Laterality Date  . Hip surgery      No family history on file.  Social History:  reports that she has never smoked. She does not have any smokeless tobacco history on file. She reports that she does not drink alcohol or use illicit drugs.  Allergies: No Known Allergies   Results for orders placed or performed during the hospital encounter of 11/08/15 (from the past 48 hour(s))  CBC WITH DIFFERENTIAL     Status: Abnormal   Collection Time: 11/08/15  7:08 PM  Result Value Ref Range   WBC 9.9 4.0 - 10.5 K/uL   RBC 4.06 3.87 - 5.11 MIL/uL   Hemoglobin 11.6 (L) 12.0 - 15.0 g/dL   HCT 47.8 29.5 - 62.1 %   MCV 89.9 78.0 - 100.0 fL   MCH 28.6 26.0 - 34.0 pg   MCHC 31.8 30.0 - 36.0 g/dL   RDW 30.8 65.7 - 84.6 %   Platelets 226 150 - 400 K/uL   Neutrophils Relative % 86 %   Neutro Abs 8.4 (H) 1.7 - 7.7 K/uL   Lymphocytes Relative 7 %   Lymphs Abs 0.7 0.7 - 4.0 K/uL   Monocytes Relative 7 %   Monocytes Absolute 0.7 0.1 - 1.0 K/uL   Eosinophils Relative 0 %   Eosinophils Absolute 0.0 0.0 - 0.7 K/uL   Basophils Relative 0 %   Basophils Absolute 0.0 0.0 - 0.1 K/uL    Dg Chest Port 1 View  11/08/2015  CLINICAL DATA:  Pre-op chest exam; right hip fracture; h/o HTN EXAM: PORTABLE CHEST 1 VIEW COMPARISON:  02/03/2010 FINDINGS: Lungs are hyperinflated. There is  perihilar peribronchial thickening. There are no focal consolidations or pleural effusions. No pulmonary edema. IMPRESSION: Bronchitic changes. Electronically Signed   By: Norva Pavlov M.D.   On: 11/08/2015 17:50   Dg Hip Unilat With Pelvis 2-3 Views Right  11/08/2015  CLINICAL DATA:  Status post trip and fall today with a right hip injury. Pain. Initial encounter. EXAM: DG HIP (WITH OR WITHOUT PELVIS) 2-3V RIGHT COMPARISON:  None. FINDINGS: There is an acute mildly impacted subcapital fracture of the right hip. No other acute bony or joint abnormality is identified. Left hip replacement is noted. Bones are osteopenic. IMPRESSION: Acute mildly impacted subcapital fracture right hip. Osteopenia. Electronically Signed   By: Drusilla Kanner M.D.   On: 11/08/2015 16:25     Vitals Temp:  [97.9 F (36.6 C)] 97.9 F (36.6 C) (02/19 1454) Pulse Rate:  [65-71] 71 (02/19 1830) Resp:  [16-26] 25 (02/19 1830) BP: (101-192)/(63-101) 138/77 mmHg (02/19 1830) SpO2:  [88 %-100 %] 92 % (02/19 1830) Weight:  [63.504 kg (140 lb)] 63.504 kg (140 lb) (02/19 1454) Body mass index is 22.93 kg/(m^2).  Physical Exam: thin elderly WF, /AT. Cooperative with exam. Neck non-tender, no clavicular tenderness, no  gross bone or joint instability in UE's. Diffuse tenderness with palpation about right hip. Pain with attempts at right hip motion. N/V intact distally BLE. LLE stable and non tender.     Assessment/Plan: Impression:  Impacted right femoral neck fracture  Treatment:  I have spoken with Natasha Beasley and her family regarding treatment options and risks vs benefits thereof. I have communicated with Dr. Charlann Boxer who is available to take over orthopedic care and anticipate surgery tomorrow. NPO after midnight.  Sarita Hakanson M Mccayla Shimada 11/08/2015, 8:13 PM  Contact # [phone]

## 2015-11-08 NOTE — ED Notes (Signed)
Pt placed on 2Liters O2 via [place]

## 2015-11-08 NOTE — ED Notes (Addendum)
Pt was lurch and tripped over rug falling and hurt rt hip, no shortening or rotation of rt leg, has had partial replacement on that side good motor sensor  , good pulse rt foot, toes are cool both feet good blanche rt footwas placed on monitor by ems and found to have rt bundle block, pt reports no cp no sob no syncopy

## 2015-11-08 NOTE — ED Notes (Signed)
Report attempted. RN receiving patient is unavailable.

## 2015-11-08 NOTE — ED Notes (Signed)
2nd attempt at report. RN busy with other patient.

## 2015-11-08 NOTE — ED Provider Notes (Addendum)
CSN: [id number]     Arrival date & time 11/08/15  1446 History   First MD Initiated Contact with Patient 11/08/15 1502     Chief Complaint  Patient presents with  . Hip Pain     HPI Patient presents the emergency department after a fall while at church today.  She states that she fell and tripped over a ride and injured her right hip.  She denies headache or head injury.  She denies neck pain.  She denies weakness of her upper lower extremities.  No chest pain shortness breath.  Denies abdominal pain.  Reports no pain in her upper extremities or her left lower extremity.  She points to her right groin and right hip when she describes the pain that she is feeling.  Her pain is moderate in severity.  Her pain is worse with palpation and movement of her right hip.   Past Medical History  Diagnosis Date  . Hypertension   . Hypercholesteremia   . Thyroid disease   . Anxiety   . Depression    Past Surgical History  Procedure Laterality Date  . Hip surgery     No family history on file. Social History  Substance Use Topics  . Smoking status: Never Smoker   . Smokeless tobacco: None  . Alcohol Use: No   OB History    No data available     Review of Systems  All other systems reviewed and are negative.     Allergies  Review of patient's allergies indicates no known allergies.  Home Medications   Prior to Admission medications   Medication Sig Start Date End Date Taking? Authorizing Provider  atenolol (TENORMIN) 50 MG tablet Take 50 mg by mouth daily.     Yes Historical Provider, MD  ergocalciferol (VITAMIN D2) 50000 UNITS capsule Take 50,000 Units by mouth every 14 (fourteen) days. Every other sunday   Yes Historical Provider, MD  escitalopram (LEXAPRO) 10 MG tablet Take 10 mg by mouth daily.     Yes Historical Provider, MD  hydrochlorothiazide 25 MG tablet Take 25 mg by mouth daily.     Yes Historical Provider, MD  levothyroxine (SYNTHROID, LEVOTHROID) 100 MCG tablet Take  100 mcg by mouth daily. On an empty stomach    Yes Historical Provider, MD  losartan (COZAAR) 25 MG tablet Take 25 mg by mouth daily.   Yes Historical Provider, MD  ondansetron (ZOFRAN) 4 MG tablet Take 4 mg by mouth every 8 (eight) hours as needed for nausea or vomiting. Reported on 11/08/2015   Yes Historical Provider, MD  rosuvastatin (CRESTOR) 10 MG tablet Take 10 mg by mouth daily.     Yes Historical Provider, MD   BP 114/101 mmHg  Pulse 65  Temp(Src) 97.9 F (36.6 C) (Oral)  Resp 18  Wt 140 lb (63.504 kg)  SpO2 95% Physical Exam  Constitutional: She is oriented to person, place, and time. She appears well-developed and well-nourished. No distress.  HENT:  Head: Normocephalic and atraumatic.  Eyes: EOM are normal.  Neck: Normal range of motion. Neck supple.  C-spine nontender.  Cardiovascular: Normal rate, regular rhythm and normal heart sounds.   Pulmonary/Chest: Effort normal and breath sounds normal.  Abdominal: Soft. She exhibits no distension. There is no tenderness.  Musculoskeletal: Normal range of motion.  Pain with range of motion of right hip.  No obvious deformity of the right lower extremity.  No shortening or rotation.  Full range of motion of left ankle,  left knee, left hip.  Full range of motion of major joints of upper extremities.  Neurological: She is alert and oriented to person, place, and time.  Skin: Skin is warm and dry.  Psychiatric: She has a normal mood and affect. Judgment normal.  Nursing note and vitals reviewed.   ED Course  Procedures (including critical care time) Labs Review Labs Reviewed  BASIC METABOLIC PANEL  CBC WITH DIFFERENTIAL/PLATELET  PROTIME-INR  TYPE AND SCREEN    Imaging Review Dg Hip Unilat With Pelvis 2-3 Views Right  11/08/2015  CLINICAL DATA:  Status post trip and fall today with a right hip injury. Pain. Initial encounter. EXAM: DG HIP (WITH OR WITHOUT PELVIS) 2-3V RIGHT COMPARISON:  None. FINDINGS: There is an acute  mildly impacted subcapital fracture of the right hip. No other acute bony or joint abnormality is identified. Left hip replacement is noted. Bones are osteopenic. IMPRESSION: Acute mildly impacted subcapital fracture right hip. Osteopenia. Electronically Signed   By: Drusilla Kanner M.D.   On: 11/08/2015 16:25   I have personally reviewed and evaluated these images and lab results as part of my medical decision-making.   EKG Interpretation   Date/Time:  Sunday November 08 2015 14:56:42 EST Ventricular Rate:  70 PR Interval:    QRS Duration: 152 QT Interval:  438 QTC Calculation: 473 R Axis:   83 Text Interpretation:  normal sinus rhythm Ventricular premature complex  Right bundle branch block Anteroseptal infarct, age indeterminate  Confirmed by Brittay Mogle  MD, Caryn Bee ([id number]) on 11/08/2015 5:04:31 PM      MDM   Final diagnoses:  None    Impacted right side subcapital hip fracture.  Patient be admitted the hospital.  Preoperative labs, EKG, chest x-ray ordered.  Pain control.  Fall sounds mechanical.  Patient is not on anticoagulants.    Azalia Bilis, MD 11/08/15 1703  Azalia Bilis, MD 11/08/15 [phone]

## 2015-11-08 NOTE — Progress Notes (Signed)
Called Ed. Report rec'd from ED nurse. Awaiting patient's arrival.

## 2015-11-09 ENCOUNTER — Encounter (HOSPITAL_COMMUNITY): Payer: Self-pay | Admitting: Anesthesiology

## 2015-11-09 ENCOUNTER — Inpatient Hospital Stay (HOSPITAL_COMMUNITY): Payer: Medicare Other

## 2015-11-09 ENCOUNTER — Inpatient Hospital Stay (HOSPITAL_COMMUNITY): Payer: Medicare Other | Admitting: Anesthesiology

## 2015-11-09 ENCOUNTER — Encounter (HOSPITAL_COMMUNITY)
Admission: EM | Disposition: A | Discharge status: Skilled Nursing Facility | Payer: Self-pay | Source: Home / Self Care | Attending: Internal Medicine

## 2015-11-09 DIAGNOSIS — E785 Hyperlipidemia, unspecified: Secondary | ICD-10-CM

## 2015-11-09 DIAGNOSIS — S72001A Fracture of unspecified part of neck of right femur, initial encounter for closed fracture: Secondary | ICD-10-CM

## 2015-11-09 DIAGNOSIS — I10 Essential (primary) hypertension: Secondary | ICD-10-CM

## 2015-11-09 HISTORY — PX: HIP PINNING,CANNULATED: SHX1758

## 2015-11-09 LAB — CBC WITH DIFFERENTIAL/PLATELET
BASOS PCT: 0 %
Basophils Absolute: 0 10*3/uL (ref 0.0–0.1)
EOS ABS: 0.2 10*3/uL (ref 0.0–0.7)
Eosinophils Relative: 2 %
HEMATOCRIT: 34.4 % — AB (ref 36.0–46.0)
HEMOGLOBIN: 10.8 g/dL — AB (ref 12.0–15.0)
LYMPHS ABS: 0.8 10*3/uL (ref 0.7–4.0)
Lymphocytes Relative: 10 %
MCH: 28.3 pg (ref 26.0–34.0)
MCHC: 31.4 g/dL (ref 30.0–36.0)
MCV: 90.3 fL (ref 78.0–100.0)
MONO ABS: 0.4 10*3/uL (ref 0.1–1.0)
MONOS PCT: 5 %
NEUTROS PCT: 83 %
Neutro Abs: 6.5 10*3/uL (ref 1.7–7.7)
Platelets: 193 10*3/uL (ref 150–400)
RBC: 3.81 MIL/uL — ABNORMAL LOW (ref 3.87–5.11)
RDW: 13.5 % (ref 11.5–15.5)
WBC: 7.9 10*3/uL (ref 4.0–10.5)

## 2015-11-09 LAB — COMPREHENSIVE METABOLIC PANEL
ALK PHOS: 53 U/L (ref 38–126)
ALT: 10 U/L — ABNORMAL LOW (ref 14–54)
ANION GAP: 13 (ref 5–15)
AST: 19 U/L (ref 15–41)
Albumin: 3.4 g/dL — ABNORMAL LOW (ref 3.5–5.0)
BUN: 17 mg/dL (ref 6–20)
CALCIUM: 9.2 mg/dL (ref 8.9–10.3)
CO2: 24 mmol/L (ref 22–32)
Chloride: 103 mmol/L (ref 101–111)
Creatinine, Ser: 1.53 mg/dL — ABNORMAL HIGH (ref 0.44–1.00)
GFR calc non Af Amer: 29 mL/min — ABNORMAL LOW (ref >60)
GFR, EST AFRICAN AMERICAN: 34 mL/min — AB (ref >60)
GLUCOSE: 124 mg/dL — AB (ref 65–99)
POTASSIUM: 4.2 mmol/L (ref 3.5–5.1)
SODIUM: 140 mmol/L (ref 135–145)
Total Bilirubin: 0.6 mg/dL (ref 0.3–1.2)
Total Protein: 6.6 g/dL (ref 6.5–8.1)

## 2015-11-09 LAB — URINALYSIS, ROUTINE W REFLEX MICROSCOPIC
BILIRUBIN URINE: NEGATIVE
GLUCOSE, UA: NEGATIVE mg/dL
Hgb urine dipstick: NEGATIVE
KETONES UR: NEGATIVE mg/dL
Nitrite: NEGATIVE
PH: 5.5 (ref 5.0–8.0)
Protein, ur: NEGATIVE mg/dL
SPECIFIC GRAVITY, URINE: 1.016 (ref 1.005–1.030)

## 2015-11-09 LAB — URINE MICROSCOPIC-ADD ON: RBC / HPF: NONE SEEN RBC/hpf (ref 0–5)

## 2015-11-09 LAB — SURGICAL PCR SCREEN
MRSA, PCR: NEGATIVE
STAPHYLOCOCCUS AUREUS: NEGATIVE

## 2015-11-09 SURGERY — FIXATION, FEMUR, NECK, PERCUTANEOUS, USING SCREW
Anesthesia: General | Site: Hip | Laterality: Right

## 2015-11-09 MED ORDER — EPHEDRINE SULFATE 50 MG/ML IJ SOLN
INTRAMUSCULAR | Status: DC | PRN
  Administered 2015-11-09: 10 mg via INTRAVENOUS

## 2015-11-09 MED ORDER — MORPHINE SULFATE (PF) 2 MG/ML IV SOLN: 0.5000 mg | INTRAVENOUS | Status: DC | PRN

## 2015-11-09 MED ORDER — ASPIRIN EC 325 MG PO TBEC
325.0000 mg | DELAYED_RELEASE_TABLET | Freq: Every day | ORAL | Status: DC
  Administered 2015-11-10 – 2015-11-11 (×2): 325 mg via ORAL
  Filled 2015-11-09 (×2): qty 1

## 2015-11-09 MED ORDER — LACTATED RINGERS IV SOLN
INTRAVENOUS | Status: DC | PRN
  Administered 2015-11-09: 20:00:00 via INTRAVENOUS

## 2015-11-09 MED ORDER — ONDANSETRON HCL 4 MG/2ML IJ SOLN: 4.0000 mg | Freq: Once | INTRAMUSCULAR | Status: DC | PRN

## 2015-11-09 MED ORDER — METOCLOPRAMIDE HCL 5 MG/ML IJ SOLN
5.0000 mg | Freq: Three times a day (TID) | INTRAMUSCULAR | Status: DC | PRN
  Administered 2015-11-10: 10 mg via INTRAVENOUS
  Filled 2015-11-09: qty 2

## 2015-11-09 MED ORDER — PHENYLEPHRINE HCL 10 MG/ML IJ SOLN
INTRAMUSCULAR | Status: DC | PRN
  Administered 2015-11-09: 40 ug via INTRAVENOUS

## 2015-11-09 MED ORDER — LORAZEPAM 1 MG PO TABS
1.0000 mg | ORAL_TABLET | Freq: Once | ORAL | Status: AC
  Administered 2015-11-09: 1 mg via ORAL
  Filled 2015-11-09: qty 1

## 2015-11-09 MED ORDER — ALUM & MAG HYDROXIDE-SIMETH 200-200-20 MG/5ML PO SUSP: 30.0000 mL | ORAL | Status: DC | PRN

## 2015-11-09 MED ORDER — CEFAZOLIN SODIUM 1-5 GM-% IV SOLN
1.0000 g | Freq: Four times a day (QID) | INTRAVENOUS | Status: AC
  Administered 2015-11-10 (×2): 1 g via INTRAVENOUS
  Filled 2015-11-09 (×2): qty 50

## 2015-11-09 MED ORDER — PROPOFOL 10 MG/ML IV BOLUS
INTRAVENOUS | Status: DC | PRN
  Administered 2015-11-09: 120 mg via INTRAVENOUS

## 2015-11-09 MED ORDER — FENTANYL CITRATE (PF) 100 MCG/2ML IJ SOLN
INTRAMUSCULAR | Status: DC | PRN
  Administered 2015-11-09: 50 ug via INTRAVENOUS
  Administered 2015-11-09: 100 ug via INTRAVENOUS

## 2015-11-09 MED ORDER — ACETAMINOPHEN 325 MG PO TABS: 650.0000 mg | ORAL_TABLET | Freq: Four times a day (QID) | ORAL | Status: DC | PRN

## 2015-11-09 MED ORDER — DOCUSATE SODIUM 100 MG PO CAPS
100.0000 mg | ORAL_CAPSULE | Freq: Two times a day (BID) | ORAL | Status: DC
  Administered 2015-11-10 – 2015-11-11 (×3): 100 mg via ORAL
  Filled 2015-11-09 (×2): qty 1

## 2015-11-09 MED ORDER — PHENOL 1.4 % MT LIQD
1.0000 | OROMUCOSAL | Status: DC | PRN
Start: 2015-11-09 — End: 2015-11-11

## 2015-11-09 MED ORDER — POLYETHYLENE GLYCOL 3350 17 G PO PACK: 17.0000 g | PACK | Freq: Every day | ORAL | Status: DC | PRN

## 2015-11-09 MED ORDER — MEPERIDINE HCL 25 MG/ML IJ SOLN: 6.2500 mg | INTRAMUSCULAR | Status: DC | PRN

## 2015-11-09 MED ORDER — FENTANYL CITRATE (PF) 250 MCG/5ML IJ SOLN
INTRAMUSCULAR | Status: AC
  Filled 2015-11-09: qty 5

## 2015-11-09 MED ORDER — ENSURE ENLIVE PO LIQD
237.0000 mL | Freq: Every day | ORAL | Status: DC
  Administered 2015-11-10: 237 mL via ORAL

## 2015-11-09 MED ORDER — CEFAZOLIN SODIUM-DEXTROSE 2-3 GM-% IV SOLR
INTRAVENOUS | Status: DC | PRN
  Administered 2015-11-09: 2 g via INTRAVENOUS

## 2015-11-09 MED ORDER — ACETAMINOPHEN 650 MG RE SUPP: 650.0000 mg | Freq: Four times a day (QID) | RECTAL | Status: DC | PRN

## 2015-11-09 MED ORDER — LIDOCAINE HCL (CARDIAC) 20 MG/ML IV SOLN
INTRAVENOUS | Status: DC | PRN
  Administered 2015-11-09: 100 mg via INTRAVENOUS

## 2015-11-09 MED ORDER — ONDANSETRON HCL 4 MG/2ML IJ SOLN
INTRAMUSCULAR | Status: AC
  Filled 2015-11-09: qty 2

## 2015-11-09 MED ORDER — ONDANSETRON HCL 4 MG/2ML IJ SOLN
INTRAMUSCULAR | Status: DC | PRN
  Administered 2015-11-09: 4 mg via INTRAVENOUS

## 2015-11-09 MED ORDER — CEFAZOLIN SODIUM-DEXTROSE 2-3 GM-% IV SOLR
INTRAVENOUS | Status: AC
  Filled 2015-11-09: qty 50

## 2015-11-09 MED ORDER — MENTHOL 3 MG MT LOZG: 1.0000 | LOZENGE | OROMUCOSAL | Status: DC | PRN

## 2015-11-09 MED ORDER — HYDROMORPHONE HCL 1 MG/ML IJ SOLN: 0.2500 mg | INTRAMUSCULAR | Status: DC | PRN

## 2015-11-09 MED ORDER — SUCCINYLCHOLINE CHLORIDE 20 MG/ML IJ SOLN
INTRAMUSCULAR | Status: DC | PRN
  Administered 2015-11-09: 140 mg via INTRAVENOUS

## 2015-11-09 MED ORDER — ACETAMINOPHEN 500 MG PO TABS
1000.0000 mg | ORAL_TABLET | Freq: Four times a day (QID) | ORAL | Status: AC
  Administered 2015-11-10 (×3): 1000 mg via ORAL
  Filled 2015-11-09 (×3): qty 2

## 2015-11-09 MED ORDER — ONDANSETRON HCL 4 MG PO TABS: 4.0000 mg | ORAL_TABLET | Freq: Four times a day (QID) | ORAL | Status: DC | PRN

## 2015-11-09 MED ORDER — TRAMADOL HCL 50 MG PO TABS
50.0000 mg | ORAL_TABLET | Freq: Four times a day (QID) | ORAL | Status: DC | PRN
  Administered 2015-11-10: 50 mg via ORAL
  Filled 2015-11-09: qty 1

## 2015-11-09 MED ORDER — METOCLOPRAMIDE HCL 5 MG PO TABS: 5.0000 mg | ORAL_TABLET | Freq: Three times a day (TID) | ORAL | Status: DC | PRN

## 2015-11-09 MED ORDER — ONDANSETRON HCL 4 MG/2ML IJ SOLN: 4.0000 mg | Freq: Four times a day (QID) | INTRAMUSCULAR | Status: DC | PRN

## 2015-11-09 MED ORDER — 0.9 % SODIUM CHLORIDE (POUR BTL) OPTIME
TOPICAL | Status: DC | PRN
  Administered 2015-11-09: 1000 mL

## 2015-11-09 SURGICAL SUPPLY — 42 items
COVER PERINEAL POST (MISCELLANEOUS) ×3 IMPLANT
COVER SURGICAL LIGHT HANDLE (MISCELLANEOUS) ×3 IMPLANT
DERMABOND ADVANCED (GAUZE/BANDAGES/DRESSINGS) ×2
DERMABOND ADVANCED .7 DNX12 (GAUZE/BANDAGES/DRESSINGS) ×1 IMPLANT
DRAPE STERI IOBAN 125X83 (DRAPES) ×3 IMPLANT
DRAPE SURG 17X23 STRL (DRAPES) IMPLANT
DRSG ADAPTIC 3X8 NADH LF (GAUZE/BANDAGES/DRESSINGS) ×3 IMPLANT
DRSG MEPILEX BORDER 4X4 (GAUZE/BANDAGES/DRESSINGS) ×3 IMPLANT
DURAPREP 26ML APPLICATOR (WOUND CARE) ×3 IMPLANT
ELECT REM PT RETURN 9FT ADLT (ELECTROSURGICAL) ×3
ELECTRODE REM PT RTRN 9FT ADLT (ELECTROSURGICAL) ×1 IMPLANT
FACESHIELD WRAPAROUND (MASK) IMPLANT
GLOVE BIOGEL PI IND STRL 6.5 (GLOVE) ×2 IMPLANT
GLOVE BIOGEL PI IND STRL 7.5 (GLOVE) IMPLANT
GLOVE BIOGEL PI IND STRL 8 (GLOVE) ×1 IMPLANT
GLOVE BIOGEL PI INDICATOR 6.5 (GLOVE) ×4
GLOVE BIOGEL PI INDICATOR 7.5 (GLOVE)
GLOVE BIOGEL PI INDICATOR 8 (GLOVE) ×2
GLOVE ORTHO TXT STRL SZ7.5 (GLOVE) ×3 IMPLANT
GLOVE SURG ORTHO 8.0 STRL STRW (GLOVE) IMPLANT
GOWN STRL REUS W/ TWL LRG LVL3 (GOWN DISPOSABLE) ×3 IMPLANT
GOWN STRL REUS W/TWL LRG LVL3 (GOWN DISPOSABLE) ×6
KIT BASIN OR (CUSTOM PROCEDURE TRAY) ×3 IMPLANT
KIT ROOM TURNOVER OR (KITS) ×3 IMPLANT
LINER BOOT UNIVERSAL DISP (MISCELLANEOUS) ×3 IMPLANT
MANIFOLD NEPTUNE II (INSTRUMENTS) IMPLANT
NS IRRIG 1000ML POUR BTL (IV SOLUTION) ×3 IMPLANT
PACK GENERAL/GYN (CUSTOM PROCEDURE TRAY) ×3 IMPLANT
PAD ARMBOARD 7.5X6 YLW CONV (MISCELLANEOUS) ×6 IMPLANT
PIN GUIDE DRILL TIP 2.8X300 (DRILL) ×9 IMPLANT
SCREW CANN 6.5X100X40MM THRD (Screw) ×3 IMPLANT
SCREW CANN 90X16X6.5 NS (Screw) ×2 IMPLANT
SCREW CANNULATED 6.5X105MM (Screw) ×3 IMPLANT
SCREW CANNULATED 6.5X90MM (Screw) ×4 IMPLANT
STAPLER VISISTAT 35W (STAPLE) ×3 IMPLANT
SUT VIC AB 0 CT1 27 (SUTURE) ×2
SUT VIC AB 0 CT1 27XBRD ANBCTR (SUTURE) ×1 IMPLANT
SUT VIC AB 2-0 CT1 27 (SUTURE) ×2
SUT VIC AB 2-0 CT1 TAPERPNT 27 (SUTURE) ×1 IMPLANT
TOWEL OR 17X24 6PK STRL BLUE (TOWEL DISPOSABLE) ×3 IMPLANT
TOWEL OR 17X26 10 PK STRL BLUE (TOWEL DISPOSABLE) ×3 IMPLANT
WATER STERILE IRR 1000ML POUR (IV SOLUTION) IMPLANT

## 2015-11-09 NOTE — Progress Notes (Signed)
Patient's signed consent for closed reduction percutaneous screw fixation of the right hip is in the chart and signed by her Son (POA) Natasha Beasley.

## 2015-11-09 NOTE — Progress Notes (Signed)
Patient ID: Natasha Beasley, female   DOB: [birth year], 82 y.o.   MRN: [id number]  Right non-displaced femoral neck fracture - appears to be stable pattern  Niece in room with over night States that she has been rather restless, but otherwise no events  Discussed OR possibilities tonight Consent ordered NPO after 10 am for surgery this pm after 7:30p\  Plan to close reduce and place percutaneous screws due to fracture pattern, has history of left hip hemiarthroplasty

## 2015-11-09 NOTE — Progress Notes (Signed)
Initial Nutrition Assessment  DOCUMENTATION CODES:   Not applicable  INTERVENTION:  Once diet advances, provide Ensure Enlive po once daily, each supplement provides 350 kcal and 20 grams of protein.  NUTRITION DIAGNOSIS:   Increased nutrient needs related to  (surgery) as evidenced by estimated needs.  GOAL:   Patient will meet greater than or equal to 90% of their needs  MONITOR:   Diet advancement, Supplement acceptance, Weight trends, Labs, I & O's, Skin  REASON FOR ASSESSMENT:   Consult Hip fracture protocol  ASSESSMENT:   82 y.o. female with history of hypertension, hypothyroidism, hyperlipidemia had a fall while at the church. As per patient's son patient tripped on the mat and fell. Denies losing consciousness or hitting her head. X-rays reveal right hip fracture   Pt is currently NPO for surgery today. Family at bedside. They report pt has been eating well PTA with consumption of at least 3 meals a day with snacks in between. Per Epic weight records, pt with no weight loss. Pt is agreeable to Ensure once diet advances to aid in healing. RD to order. Pt with no observed significant fat or muscle mass loss.   Labs and medications reviewed.   Diet Order:  Diet NPO time specified  Skin:  Reviewed, no issues  Last BM:  2/19  Height:   Ht Readings from Last 1 Encounters:  11/08/15 5' (1.524 m)    Weight:   Wt Readings from Last 1 Encounters:  11/08/15 151 lb 0.2 oz (68.5 kg)    Ideal Body Weight:  45.45 kg  BMI:  Body mass index is 29.49 kg/(m^2).  Estimated Nutritional Needs:   Kcal:  1600-1800  Protein:  70-80 grams  Fluid:  1.6 - 1.8 L/day  EDUCATION NEEDS:   No education needs identified at this time  Roslyn Smiling, MS, RD, LDN Pager # [phone] After hours/ weekend pager # [phone]

## 2015-11-09 NOTE — Brief Op Note (Signed)
11/08/2015 - 11/09/2015  8:05 PM  PATIENT:  Natasha Beasley  82 y.o. female  PRE-OPERATIVE DIAGNOSIS:  Right Non-displaced Femoral Neck Fracture  POST-OPERATIVE DIAGNOSIS:  Right non-displaced femoral neck fracture  PROCEDURE:  Procedure(s): CANNULATED HIP PINNING (Right)  SURGEON:  Surgeon(s) and Role:    * Durene Romans, MD - Primary  PHYSICIAN ASSISTANT: None  ANESTHESIA:   general  EBL:  Minimal  BLOOD ADMINISTERED:none  DRAINS: none   LOCAL MEDICATIONS USED:  NONE  SPECIMEN:  No Specimen  DISPOSITION OF SPECIMEN:  N/A  COUNTS:  YES  TOURNIQUET:  * No tourniquets in log *  DICTATION: .Other Dictation: Dictation Number [id number]  PLAN OF CARE: Admit to inpatient   PATIENT DISPOSITION:  PACU - hemodynamically stable.   Delay start of Pharmacological VTE agent (>24hrs) due to surgical blood loss or risk of bleeding: no

## 2015-11-09 NOTE — Anesthesia Procedure Notes (Signed)
Procedure Name: Intubation Date/Time: 11/09/2015 8:14 PM Performed by: Wray Kearns A Pre-anesthesia Checklist: Patient identified, Emergency Drugs available, Suction available, Patient being monitored and Timeout performed Patient Re-evaluated:Patient Re-evaluated prior to inductionOxygen Delivery Method: Circle system utilized Preoxygenation: Pre-oxygenation with 100% oxygen Intubation Type: IV induction and Cricoid Pressure applied Laryngoscope Size: Mac and 4 Grade View: Grade I Tube type: Oral Tube size: 7.5 mm Number of attempts: 1 Airway Equipment and Method: Stylet Placement Confirmation: ETT inserted through vocal cords under direct vision,  positive ETCO2 and breath sounds checked- equal and bilateral Secured at: 22 cm Tube secured with: Tape Dental Injury: Teeth and Oropharynx as per pre-operative assessment

## 2015-11-09 NOTE — Progress Notes (Signed)
Utilization Review Completed.Alf Doyle T2/20/2017  

## 2015-11-09 NOTE — Progress Notes (Signed)
Per Dr Roda Shutters please give pt mouth moisture swabs d/t dryness from being NPO

## 2015-11-09 NOTE — Anesthesia Preprocedure Evaluation (Addendum)
Anesthesia Evaluation  Patient identified by MRN, date of birth, ID band Patient awake    Reviewed: Allergy & Precautions, NPO status , Patient's Chart, lab work & pertinent test results  Airway Mallampati: I  TM Distance: >3 FB Neck ROM: Full    Dental   Pulmonary    Pulmonary exam normal        Cardiovascular hypertension, Pt. on medications Normal cardiovascular exam     Neuro/Psych    GI/Hepatic   Endo/Other  Hypothyroidism   Renal/GU      Musculoskeletal   Abdominal   Peds  Hematology   Anesthesia Other Findings   Reproductive/Obstetrics                            Anesthesia Physical Anesthesia Plan  ASA: III  Anesthesia Plan: General   Post-op Pain Management:    Induction: Intravenous  Airway Management Planned: Oral ETT  Additional Equipment:   Intra-op Plan:   Post-operative Plan: Extubation in OR  Informed Consent: I have reviewed the patients History and Physical, chart, labs and discussed the procedure including the risks, benefits and alternatives for the proposed anesthesia with the patient or authorized representative who has indicated his/her understanding and acceptance.     Plan Discussed with: CRNA and Surgeon  Anesthesia Plan Comments:         Anesthesia Quick Evaluation

## 2015-11-09 NOTE — Progress Notes (Signed)
[facility] PROGRESS NOTE  Natasha Beasley ZOX:[id number] DOB: 28-Mar-1925 DOA: 11/08/2015 PCP: Junious Silk, MD  HPI/Brief narrative 82 y.o. female with history of hypertension, hypothyroidism, hyperlipidemia had a fall while at the church with resultant R hip fracture.  Assessment/Plan: 1. Right hip fracture status post mechanical fall - patient note to be at moderate risk for intermediate risk procedure. Orthopedic surgery consulted and patient is planned for surgery later today 2. Acute renal failure with hyponatremia - holding off Cozaar and hydrochlorothiazide and gently hydrated  overnight. Closely follow intake output and metabolic panel. 3. Hypertension - since Cozaar and hydrochlorothiazide, cont PRN  IV hydralazine along with patient's home dose of beta blocker. 4. Hyperlipidemia - continue on statins. 5. Hypothyroidism on Synthroid. 6. Mild anemia - follow CBC.  Code Status: full Family Communication: Pt in room Disposition Plan: Uncertain at this time   Consultants:  Orthopedic Surgery  Procedures:    Antibiotics: Anti-infectives    None       HPI/Subjective: No complaints  Objective: Filed Vitals:   11/08/15 2130 11/08/15 2300 11/09/15 0510 11/09/15 1300  BP: 128/76 168/74 134/56 172/65  Pulse: 73 77 77 84  Temp:  98.3 F (36.8 C) 98.1 F (36.7 C) 98.2 F (36.8 C)  TempSrc:  Oral Oral   Resp: Height:  5' (1.524 m)    Weight:  68.5 kg (151 lb 0.2 oz)    SpO2: 89% 97% 97% 93%    Intake/Output Summary (Last 24 hours) at 11/09/15 1810 Last data filed at 11/09/15 1700  Gross per 24 hour  Intake  607.5 ml  Output   1425 ml  Net -817.5 ml   Filed Weights   11/08/15 1454 11/08/15 2300  Weight: 63.504 kg (140 lb) 68.5 kg (151 lb 0.2 oz)    Exam:   General:  Awake, in nad  Cardiovascular: regular, s1, s2  Respiratory: normal resp effort, no wheezing  Abdomen: soft, nondistended  Musculoskeletal: perfused, no  clubbing   Data Reviewed: Basic Metabolic Panel:  Recent Labs Lab 11/08/15 1908 11/09/15 0456  NA 129* 140  K 4.2 4.2  CL 97* 103  CO2 20* 24  GLUCOSE 129* 124*  BUN 15 17  CREATININE 1.46* 1.53*  CALCIUM 8.8* 9.2   Liver Function Tests:  Recent Labs Lab 11/09/15 0456  AST 19  ALT 10*  ALKPHOS 53  BILITOT 0.6  PROT 6.6  ALBUMIN 3.4*   No results for input(s): LIPASE, AMYLASE in the last 168 hours. No results for input(s): AMMONIA in the last 168 hours. CBC:  Recent Labs Lab 11/08/15 1908 11/09/15 0456  WBC 9.9 7.9  NEUTROABS 8.4* 6.5  HGB 11.6* 10.8*  HCT 36.5 34.4*  MCV 89.9 90.3  PLT 226 193   Cardiac Enzymes: No results for input(s): CKTOTAL, CKMB, CKMBINDEX, TROPONINI in the last 168 hours. BNP (last 3 results) No results for input(s): BNP in the last 8760 hours.  ProBNP (last 3 results) No results for input(s): PROBNP in the last 8760 hours.  CBG: No results for input(s): GLUCAP in the last 168 hours.  Recent Results (from the past 240 hour(s))  Surgical PCR screen     Status: None   Collection Time: 11/09/15  6:50 AM  Result Value Ref Range Status   MRSA, PCR NEGATIVE NEGATIVE Final   Staphylococcus aureus NEGATIVE NEGATIVE Final    Comment:        The Xpert SA Assay (FDA approved for NASAL specimens  in patients over 35 years of age), is one component of a comprehensive surveillance program.  Test performance has been validated by [facility] for patients greater than or equal to 33 year old. It is not intended to diagnose infection nor to guide or monitor treatment.      Studies: Dg Chest Port 1 View  11/08/2015  CLINICAL DATA:  Pre-op chest exam; right hip fracture; h/o HTN EXAM: PORTABLE CHEST 1 VIEW COMPARISON:  02/03/2010 FINDINGS: Lungs are hyperinflated. There is perihilar peribronchial thickening. There are no focal consolidations or pleural effusions. No pulmonary edema. IMPRESSION: Bronchitic changes. Electronically Signed    By: Norva Pavlov M.D.   On: 11/08/2015 17:50   Dg Hip Unilat With Pelvis 2-3 Views Right  11/08/2015  CLINICAL DATA:  Status post trip and fall today with a right hip injury. Pain. Initial encounter. EXAM: DG HIP (WITH OR WITHOUT PELVIS) 2-3V RIGHT COMPARISON:  None. FINDINGS: There is an acute mildly impacted subcapital fracture of the right hip. No other acute bony or joint abnormality is identified. Left hip replacement is noted. Bones are osteopenic. IMPRESSION: Acute mildly impacted subcapital fracture right hip. Osteopenia. Electronically Signed   By: Drusilla Kanner M.D.   On: 11/08/2015 16:25    Scheduled Meds: . atenolol  50 mg Oral Daily  . escitalopram  10 mg Oral Daily  . [START ON 11/10/2015] feeding supplement (ENSURE ENLIVE)  237 mL Oral Q1500  . levothyroxine  100 mcg Oral QAC breakfast  . rosuvastatin  10 mg Oral Daily   Continuous Infusions: . sodium chloride 1,000 mL (11/08/15 2354)    Principal Problem:   Closed right hip fracture (HCC) Active Problems:   ARF (acute renal failure) (HCC)   Hypertension   HLD (hyperlipidemia)   Hypothyroidism   Hip fracture (HCC)  CHIU, STEPHEN K  [facility] Pager [phone]. If 7PM-7AM, please contact night-coverage at [email or web address], password [facility] 11/09/2015, 6:10 PM  LOS: 1 day

## 2015-11-09 NOTE — Anesthesia Postprocedure Evaluation (Signed)
Anesthesia Post Note  Patient: Natasha Beasley  Procedure(s) Performed: Procedure(s) (LRB): CANNULATED HIP PINNING (Right)  Patient location during evaluation: PACU Anesthesia Type: General Level of consciousness: awake and alert Pain management: pain level controlled Vital Signs Assessment: post-procedure vital signs reviewed and stable Respiratory status: spontaneous breathing, nonlabored ventilation, respiratory function stable and patient connected to nasal cannula oxygen Cardiovascular status: blood pressure returned to baseline and stable Postop Assessment: no signs of nausea or vomiting Anesthetic complications: no    Last Vitals:  Filed Vitals:   11/09/15 2130 11/09/15 2145  BP: 141/49   Pulse: 87 89  Temp:    Resp: 20 20    Last Pain:  Filed Vitals:   11/09/15 2146  PainSc: 0-No pain                 Keilin Gamboa DAVID

## 2015-11-09 NOTE — Transfer of Care (Signed)
Immediate Anesthesia Transfer of Care Note  Patient: Natasha Beasley  Procedure(s) Performed: Procedure(s): CANNULATED HIP PINNING (Right)  Patient Location: PACU  Anesthesia Type:General  Level of Consciousness: sedated and responds to stimulation  Airway & Oxygen Therapy: Patient Spontanous Breathing and Patient connected to nasal cannula oxygen  Post-op Assessment: Report given to RN and Post -op Vital signs reviewed and stable  Post vital signs: Reviewed and stable  Last Vitals:  Filed Vitals:   11/09/15 0510 11/09/15 1300  BP: 134/56 172/65  Pulse: 77 84  Temp: 36.7 C 36.8 C  Resp: 18 18    Complications: No apparent anesthesia complications

## 2015-11-09 NOTE — Op Note (Signed)
Natasha, Beasley            ACCOUNT NO.:  [id number]  MEDICAL RECORD NO.:  [id number]  LOCATION:  [facility]                         FACILITY:  [facility]  PHYSICIAN:  Madlyn Frankel. Charlann Boxer, M.D.  DATE OF BIRTH:  1924-10-24  DATE OF PROCEDURE:  11/09/2015 DATE OF DISCHARGE:                              OPERATIVE REPORT   PREOPERATIVE DIAGNOSIS:  Nondisplaced right femoral neck fracture.  POSTOPERATIVE DIAGNOSIS:  Nondisplaced right femoral neck fracture.  PROCEDURE:  Closed reduction and percutaneous cannulated screw fixation of right femoral neck fracture utilizing a Biomet 8.0-mm cannulated screws with 40-mm cancellous threads x3.  SURGEON:  Madlyn Frankel. Charlann Boxer, M.D.  ASSISTANT:  Surgical team.  ANESTHESIA:  General.  SPECIMENS:  None.  COMPLICATION:  None.  BLOOD LOSS:  Minimal.  INDICATIONS FOR PROCEDURE:  Natasha Beasley is an 82 year old female, who had presented to the office after falling after a church event, stumbling over a carpet.  She landed on her right hip, had immediate onset of pain.  She was brought to the emergency room where radiographs revealed a nondisplaced, subcapital femoral neck fracture.  Ms. Kroboth have been a patient of mine from previous left hip hemiarthroplasty.  I was consulted for surgical management.  Fracture pattern was reviewed with the family.  Risks and benefits of percutaneous cannulated screw fixation versus hemiarthroplasty were discussed including potential malunion, nonunion, need for future surgery, as well as standard risks of infection and DVT.  Consent was obtained for fracture management to maintain her articular surface.  PROCEDURE IN DETAIL:  The patient was brought to the operative theater. Once adequate anesthesia, preoperative antibiotics, Ancef 2 g was administered.  She was positioned supine on the fracture table.  Her right foot was positioned into the traction boot.  Her left unaffected extremity, at this point, was flexed  and abducted out of the way with bony prominences padded particularly over the peroneal nerve laterally.  She was positioned against the peroneal post.  Gentle traction for positioning only and internal rotation was applied.  Fluoroscopy was used to confirm maintenance of the fracture pattern.  The right lower extremity was then prepped and draped in sterile fashion using shower curtain technique.  A time-out was performed identifying the patient, planned procedure and extremity.  Fluoroscopy was brought back to the field.  Landmarks were identified and marked on the [place] drape.  An incision was then made lateral at the level of the lesser trochanter and initial guidewire was placed into the center of the shaft of the femur proximally and directed towards the inferior aspect of the femoral neck and head and then confirmed radiographically in both AP and lateral planes.  Once this had disorientation, I placed a posterior pin proximal and an anterior pin proximal, these were again confirmed radiographically.  I tried to have these diverge little bit into the femoral head, but yet parallel repair in the AP plane.  Once all of the pins were positioned, I measured the depths, selected the appropriate screws.  The first screw was placed with the inferior screw and it was tightened down the lateral cortex with good purchase. Following this, the posterior screw was placed with good purchase and following that, the  anterior screw.  Once all screws were positioned, the guidewire was removed, final radiographs were obtained in AP and lateral planes. I then irrigated the wound.  I reapproximated the iliotibial band using 0 Vicryl.  The remaining wound was closed with 2-0 Vicryl and then Dermabond on the skin.  The skin was cleaned, dried and dressed sterilely using Mepilex dressing.  She was then extubated and brought to the recovery room in stable condition.  Postoperatively, I like for her  to be partial weightbearing for up to 6- 8 weeks to allow for healing.  We will progress her weightbearing as she clinically improves and then radiographs remain stable.  Findings will be reviewed with family.     Madlyn Frankel Charlann Boxer, M.D.     MDO/MEDQ  D:  11/09/2015  T:  11/09/2015  Job:  [id number]

## 2015-11-10 ENCOUNTER — Encounter (HOSPITAL_COMMUNITY): Payer: Self-pay | Admitting: Orthopedic Surgery

## 2015-11-10 DIAGNOSIS — N179 Acute kidney failure, unspecified: Secondary | ICD-10-CM

## 2015-11-10 LAB — BASIC METABOLIC PANEL
ANION GAP: 10 (ref 5–15)
BUN: 18 mg/dL (ref 6–20)
CHLORIDE: 103 mmol/L (ref 101–111)
CO2: 23 mmol/L (ref 22–32)
CREATININE: 1.15 mg/dL — AB (ref 0.44–1.00)
Calcium: 8.7 mg/dL — ABNORMAL LOW (ref 8.9–10.3)
GFR calc non Af Amer: 41 mL/min — ABNORMAL LOW (ref >60)
GFR, EST AFRICAN AMERICAN: 47 mL/min — AB (ref >60)
Glucose, Bld: 151 mg/dL — ABNORMAL HIGH (ref 65–99)
Potassium: 3.6 mmol/L (ref 3.5–5.1)
Sodium: 136 mmol/L (ref 135–145)

## 2015-11-10 MED ORDER — SODIUM CHLORIDE 0.9 % IV BOLUS (SEPSIS)
500.0000 mL | Freq: Once | INTRAVENOUS | Status: AC
  Administered 2015-11-11: 500 mL via INTRAVENOUS

## 2015-11-10 MED ORDER — ASPIRIN 325 MG PO TBEC: 325.0000 mg | DELAYED_RELEASE_TABLET | Freq: Two times a day (BID) | ORAL | Status: DC

## 2015-11-10 MED ORDER — HYDROCODONE-ACETAMINOPHEN 5-325 MG PO TABS: 1.0000 | ORAL_TABLET | Freq: Four times a day (QID) | ORAL | Status: DC | PRN

## 2015-11-10 NOTE — Evaluation (Signed)
Physical Therapy Evaluation Patient Details Name: Natasha Beasley MRN: [id number] DOB: 1925/01/11 Today's Date: 11/10/2015   History of Present Illness  Patient is an 82 yo female admitted 11/08/15 after a fall resulting in Rt hip fracture.  Patient s/p cannulated hip pinning on 11/09/15.    PMH:  HTN, anxiety, depression, Lt THA, short term memory loss (per son)  Clinical Impression  Patient presents with problems listed below.  Will benefit from acute PT to maximize functional mobility prior to discharge.  Patient requiring mod assist for transfers, and unable to ambulate today.  Recommend SNF for continued therapy at discharge.    Follow Up Recommendations SNF;Supervision/Assistance - 24 hour    Equipment Recommendations  None recommended by PT    Recommendations for Other Services       Precautions / Restrictions Precautions Precautions: Fall Restrictions Weight Bearing Restrictions: Yes RLE Weight Bearing: Partial weight bearing RLE Partial Weight Bearing Percentage or Pounds:  (Not specified by MD. Left note for MD to clarify.)      Mobility  Bed Mobility Overal bed mobility: Needs Assistance Bed Mobility: Supine to Sit     Supine to sit: Mod assist     General bed mobility comments: Assist to move RLE off of bed, and to raise trunk to sitting position.  Once upright, patient able to maintain upright sitting posture with min guard assist.  Transfers Overall transfer level: Needs assistance Equipment used: Rolling walker (2 wheeled) Transfers: Sit to/from [employer] Sit to Stand: Mod assist Stand pivot transfers: Mod assist       General transfer comment: Verbal cues for hand placement.  Used TDWB status (PWB not clarified in orders).  Assist to rise to standing and maintain balance.  Patient able to take several shuffle steps to pivot to chair.  Assist to offload RLE to be able to step with LLE.  Assist to control descent into  chair.  Ambulation/Gait                Stairs            Wheelchair Mobility    Modified Rankin (Stroke Patients Only)       Balance Overall balance assessment: Needs assistance         Standing balance support: Bilateral upper extremity supported Standing balance-Leahy Scale: Poor                               Pertinent Vitals/Pain Pain Assessment: No/denies pain (Asked multiple times during session-patient reports no pain)    Home Living Family/patient expects to be discharged to:: Skilled nursing facility Living Arrangements: Alone             Home Equipment: Walker - 2 wheels;Cane - single point;Shower seat      Prior Function Level of Independence: Independent         Comments: No assistive device used.     Hand Dominance        Extremity/Trunk Assessment   Upper Extremity Assessment: Overall WFL for tasks assessed           Lower Extremity Assessment: Generalized weakness;RLE deficits/detail RLE Deficits / Details: Decreased strength and ROM post-op    Cervical / Trunk Assessment: Kyphotic  Communication   Communication: No difficulties  Cognition Arousal/Alertness: Awake/alert Behavior During Therapy: WFL for tasks assessed/performed Overall Cognitive Status: History of cognitive impairments - at baseline (Per son, patient at baseline) Area of  Impairment: Orientation;Memory;Awareness;Safety/judgement;Problem solving Orientation Level: Disoriented to;Place;Time;Situation   Memory: Decreased short-term memory   Safety/Judgement: Decreased awareness of deficits;Decreased awareness of safety   Problem Solving: Slow processing;Difficulty sequencing;Requires verbal cues General Comments: Per son, patient has short-term memory loss.  States husband passed away 2 years ago, but it was 6 years.  Knows she is in a hospital, but states WL.  Knew she was here for surgery, but unsure of why she had surgery.    General  Comments      Exercises        Assessment/Plan    PT Assessment Patient needs continued PT services  PT Diagnosis Difficulty walking;Generalized weakness;Altered mental status   PT Problem List Decreased strength;Decreased activity tolerance;Decreased range of motion;Decreased balance;Decreased mobility;Decreased cognition;Decreased knowledge of use of DME;Decreased safety awareness;Decreased knowledge of precautions  PT Treatment Interventions DME instruction;Gait training;Functional mobility training;Therapeutic activities;Therapeutic exercise;Cognitive remediation;Patient/family education   PT Goals (Current goals can be found in the Care Plan section) Acute Rehab PT Goals Patient Stated Goal: None stated PT Goal Formulation: With patient/family Time For Goal Achievement: 11/24/15 Potential to Achieve Goals: Good    Frequency Min 5X/week   Barriers to discharge Decreased caregiver support Patient lives alone    Co-evaluation               End of Session Equipment Utilized During Treatment: Gait belt Activity Tolerance: Patient tolerated treatment well Patient left: in chair;with call bell/phone within reach;with family/visitor present Nurse Communication: Mobility status;Weight bearing status (Asking for clarification of PWB)         Time: 1610-9604 PT Time Calculation (min) (ACUTE ONLY): 24 min   Charges:   PT Evaluation $PT Eval Moderate Complexity: 1 Procedure PT Treatments $Therapeutic Activity: 8-22 mins   PT G CodesVena Austria 12-04-15, 12:41 PM Durenda Hurt. Renaldo Fiddler, [facility] Acute Rehab Services Pager [phone]

## 2015-11-10 NOTE — Clinical Social Work Note (Signed)
CSW spoke to patient and her family regarding going to a SNF for short term rehab.  Patient and family would like patient to go to [facility].  CSW contacted [facility] ([place]) who said they can take patient once she is medically ready for discharge and orders have been received.  CSW updated patient's family and they were pleased with the quick response.  CSW to continue to follow patient's progress throughout discharge planning.  Ervin Knack. Jamil Castillo, MSW, [facility] Majors [phone] 11/10/2015 3:43 PM

## 2015-11-10 NOTE — Clinical Social Work Placement (Addendum)
   CLINICAL SOCIAL WORK PLACEMENT  NOTE  Date:  11/10/2015  Patient Details  Name: Natasha Beasley MRN: [id number] Date of Birth: October 28, 1924  Clinical Social Work is seeking post-discharge placement for this patient at the Skilled  Nursing Facility level of care (*CSW will initial, date and re-position this form in  chart as items are completed):  Yes   Patient/family provided with [place] Clinical Social Work Department's list of facilities offering this level of care within the geographic area requested by the patient (or if unable, by the patient's family).  Yes   Patient/family informed of their freedom to choose among providers that offer the needed level of care, that participate in Medicare, Medicaid or managed care program needed by the patient, have an available bed and are willing to accept the patient.  Yes   Patient/family informed of [place]'s ownership interest in [facility] and [facility], as well as of the fact that they are under no obligation to receive care at these facilities.  PASRR submitted to EDS on 11/10/15     PASRR number received on       Existing PASRR number confirmed on 11/10/15     FL2 transmitted to all facilities in geographic area requested by pt/family on 11/10/15     FL2 transmitted to all facilities within larger geographic area on       Patient informed that his/her managed care company has contracts with or will negotiate with certain facilities, including the following:         11/10/15   Patient/family informed of bed offers received.  Patient chooses bed at  [facility]     Physician recommends and patient chooses bed at      Patient to be transferred to  [facility] on  11/11/15.  (updated 11/11/15 Windell Moulding, MSW, [facility]ia Majors,)  Patient to be transferred to facility by  [facility] (updated 11/11/15 Windell Moulding, MSW, LCSWA,)    Patient family notified on  11/11/15 of transfer. (updated 11/11/15 Windell Moulding,  MSW, LCSWA,)  Name of family member notified:   Phynix Horton patient's son.  (updated 11/11/15 Windell Moulding, MSW, LCSWA,)   PHYSICIAN Please sign FL2     Additional Comment:    _______________________________________________ Darleene Cleaver, LCSWA 11/10/2015, 12:45 PM

## 2015-11-10 NOTE — Progress Notes (Signed)
[facility] PROGRESS NOTE  Natasha Beasley ZOX:[id number] DOB: 09/26/1924 DOA: 11/08/2015 PCP: Junious Silk, MD  HPI/Brief narrative 82 y.o. female with history of hypertension, hypothyroidism, hyperlipidemia had a fall while at the church with resultant R hip fracture.  Assessment/Plan: 1. Right hip fracture status post mechanical fall - patient note to be at moderate risk for intermediate risk procedure. Orthopedic surgery was consulted and patient underwent pinning of R hip on 2/20 2. Acute renal failure with hyponatremia - held off Cozaar and hydrochlorothiazide and gently hydrated. Renal function has improved nicely 3. Hypertension - since Cozaar and hydrochlorothiazide, cont PRN  IV hydralazine along with patient's home dose of beta blocker. BP remains stable thus far 4. Hyperlipidemia - continue on statins. 5. Hypothyroidism on Synthroid. 6. Mild anemia - follow CBC.  Code Status: full Family Communication: Pt in room Disposition Plan: Possible d/c to snf in 24hrs   Consultants:  Orthopedic Surgery  Procedures:  Pinning of R hip on 2/20  Antibiotics: Anti-infectives    Start     Dose/Rate Route Frequency Ordered Stop   11/10/15 0200  ceFAZolin (ANCEF) IVPB 1 g/50 mL premix     1 g 100 mL/hr over 30 Minutes Intravenous 4 times per day 11/09/15 2316 11/10/15 0732      HPI/Subjective: No complaints this AM. Patient reports feeling well  Objective: Filed Vitals:   11/09/15 2215 11/09/15 2230 11/10/15 0238 11/10/15 1324  BP: 151/58 155/54 123/80 121/50  Pulse: 88 87 96 79  Temp:  97.6 F (36.4 C)  97.8 F (36.6 C)  TempSrc:    Oral  Resp: Height:      Weight:      SpO2: 100% 100% 92% 90%    Intake/Output Summary (Last 24 hours) at 11/10/15 1659 Last data filed at 11/10/15 1325  Gross per 24 hour  Intake   1140 ml  Output    975 ml  Net    165 ml   Filed Weights   11/08/15 1454 11/08/15 2300  Weight: 63.504 kg (140 lb) 68.5 kg  (151 lb 0.2 oz)    Exam:   General:  Awake, in nad, laying in bed  Cardiovascular: regular, s1, s2  Respiratory: normal resp effort, no wheezing  Abdomen: soft, nondistended  Musculoskeletal: perfused, no clubbing, no cyanosis  Data Reviewed: Basic Metabolic Panel:  Recent Labs Lab 11/08/15 1908 11/09/15 0456 11/10/15 0739  NA 129* 140 136  K 4.2 4.2 3.6  CL 97* 103 103  CO2 20* 24 23  GLUCOSE 129* 124* 151*  BUN CREATININE 1.46* 1.53* 1.15*  CALCIUM 8.8* 9.2 8.7*   Liver Function Tests:  Recent Labs Lab 11/09/15 0456  AST 19  ALT 10*  ALKPHOS 53  BILITOT 0.6  PROT 6.6  ALBUMIN 3.4*   No results for input(s): LIPASE, AMYLASE in the last 168 hours. No results for input(s): AMMONIA in the last 168 hours. CBC:  Recent Labs Lab 11/08/15 1908 11/09/15 0456  WBC 9.9 7.9  NEUTROABS 8.4* 6.5  HGB 11.6* 10.8*  HCT 36.5 34.4*  MCV 89.9 90.3  PLT 226 193   Cardiac Enzymes: No results for input(s): CKTOTAL, CKMB, CKMBINDEX, TROPONINI in the last 168 hours. BNP (last 3 results) No results for input(s): BNP in the last 8760 hours.  ProBNP (last 3 results) No results for input(s): PROBNP in the last 8760 hours.  CBG: No results for input(s): GLUCAP in the last 168 hours.  Recent Results (from the past 240 hour(s))  Surgical PCR screen     Status: None   Collection Time: 11/09/15  6:50 AM  Result Value Ref Range Status   MRSA, PCR NEGATIVE NEGATIVE Final   Staphylococcus aureus NEGATIVE NEGATIVE Final    Comment:        The Xpert SA Assay (FDA approved for NASAL specimens in patients over 56 years of age), is one component of a comprehensive surveillance program.  Test performance has been validated by [facility] for patients greater than or equal to 13 year old. It is not intended to diagnose infection nor to guide or monitor treatment.      Studies: Dg Chest Port 1 View  11/08/2015  CLINICAL DATA:  Pre-op chest exam; right hip  fracture; h/o HTN EXAM: PORTABLE CHEST 1 VIEW COMPARISON:  02/03/2010 FINDINGS: Lungs are hyperinflated. There is perihilar peribronchial thickening. There are no focal consolidations or pleural effusions. No pulmonary edema. IMPRESSION: Bronchitic changes. Electronically Signed   By: Norva Pavlov M.D.   On: 11/08/2015 17:50   Dg Hip Operative Unilat With Pelvis Right  11/09/2015  CLINICAL DATA:  Status post hip fracture fixation. EXAM: OPERATIVE right HIP (WITH PELVIS IF PERFORMED) 2 VIEWS TECHNIQUE: Fluoroscopic spot image(s) were submitted for interpretation post-operatively. COMPARISON:  Radiographs 11/08/2015 FINDINGS: There are 3 cannulated hip screws transfixing the knee subcapital hip fracture. The screws are in good position. No complicating features are demonstrated. IMPRESSION: Internal fixation of subcapital hip fracture with 3 cannulated hip screws. Electronically Signed   By: Rudie Meyer M.D.   On: 11/09/2015 21:30    Scheduled Meds: . acetaminophen  1,000 mg Oral 4 times per day  . aspirin EC  325 mg Oral Q breakfast  . atenolol  50 mg Oral Daily  . docusate sodium  100 mg Oral BID  . escitalopram  10 mg Oral Daily  . feeding supplement (ENSURE ENLIVE)  237 mL Oral Q1500  . levothyroxine  100 mcg Oral QAC breakfast  . rosuvastatin  10 mg Oral Daily   Continuous Infusions:    Principal Problem:   Closed right hip fracture (HCC) Active Problems:   ARF (acute renal failure) (HCC)   Hypertension   HLD (hyperlipidemia)   Hypothyroidism   Hip fracture (HCC)  CHIU, STEPHEN K  [facility] Pager [phone]. If 7PM-7AM, please contact night-coverage at [email or web address], password [facility] 11/10/2015, 4:59 PM  LOS: 2 days

## 2015-11-10 NOTE — [place] FL2 (Signed)
[place] MEDICA[place] FL2 LEVEL OF CARE SCREENING TOOL     IDENTIFICATION  Patient Name: Natasha Beasley Birthdate: 17-Apr-1925 Sex: female Admission Date (Current Location): 11/08/2015  [facility] and [place] Number:  Producer, television/film/video and Address:  [facility] [place], [address], [place], [place] [address]      Provider Number: [id number]  Attending Physician Name and Address:  Jerald Kief, MD  Relative Name and Phone Number:  Neko, Mcgeehan [phone] or Kamrie, Fanton [phone]    Current Level of Care: Hospital Recommended Level of Care: Skilled Nursing Facility Prior Approval Number:    Date Approved/Denied:   PASRR Number: [id number]  Discharge Plan: SNF    Current Diagnoses: Patient Active Problem List   Diagnosis Date Noted  . Closed right hip fracture (HCC) 11/08/2015  . ARF (acute renal failure) (HCC) 11/08/2015  . Hypertension 11/08/2015  . HLD (hyperlipidemia) 11/08/2015  . Hypothyroidism 11/08/2015  . Hip fracture (HCC) 11/08/2015    Orientation RESPIRATION BLADDER Height & Weight     Self  Normal Continent Weight: 151 lb 0.2 oz (68.5 kg) Height:  5' (152.4 cm)  BEHAVIORAL SYMPTOMS/MOOD NEUROLOGICAL BOWEL NUTRITION STATUS      Continent Diet (Regular)  AMBULATORY STATUS COMMUNICATION OF NEEDS Skin   Limited Assist Verbally Surgical wounds                       Personal Care Assistance Level of Assistance  Dressing, Bathing Bathing Assistance: Limited assistance   Dressing Assistance: Limited assistance     Functional Limitations Info  Hearing   Hearing Info: Impaired      SPECIAL CARE FACTORS FREQUENCY  PT (By licensed PT)     PT Frequency: 5x a week              Contractures      Additional Factors Info  Code Status Code Status Info: Full Code             Current Medications (11/10/2015):  This is the current hospital active medication list Current Facility-Administered  Medications  Medication Dose Route Frequency Provider Last Rate Last Dose  . acetaminophen (TYLENOL) tablet 650 mg  650 mg Oral Q6H PRN Durene Romans, MD       Or  . acetaminophen (TYLENOL) suppository 650 mg  650 mg Rectal Q6H PRN Durene Romans, MD      . acetaminophen (TYLENOL) tablet 1,000 mg  1,000 mg Oral 4 times per day Durene Romans, MD   1,000 mg at 11/10/15 1108  . alum & mag hydroxide-simeth (MAALOX/MYLANTA) 200-200-20 MG/5ML suspension 30 mL  30 mL Oral Q4H PRN Durene Romans, MD      . aspirin EC tablet 325 mg  325 mg Oral Q breakfast Durene Romans, MD   325 mg at 11/10/15 0846  . atenolol (TENORMIN) tablet 50 mg  50 mg Oral Daily Eduard Clos, MD   50 mg at 11/10/15 0846  . docusate sodium (COLACE) capsule 100 mg  100 mg Oral BID Durene Romans, MD   100 mg at 11/10/15 0846  . escitalopram (LEXAPRO) tablet 10 mg  10 mg Oral Daily Eduard Clos, MD   10 mg at 11/10/15 0846  . feeding supplement (ENSURE ENLIVE) (ENSURE ENLIVE) liquid 237 mL  237 mL Oral Q1500 Jerald Kief, MD      . hydrALAZINE (APRESOLINE) injection 10 mg  10 mg Intravenous Q4H PRN Eduard Clos, MD      .  HYDROcodone-acetaminophen (NORCO/VICODIN) 5-325 MG per tablet 1-2 tablet  1-2 tablet Oral Q6H PRN Eduard Clos, MD   2 tablet at 11/09/15 0102  . levothyroxine (SYNTHROID, LEVOTHROID) tablet 100 mcg  100 mcg Oral QAC breakfast Eduard Clos, MD   100 mcg at 11/10/15 0846  . menthol-cetylpyridinium (CEPACOL) lozenge 3 mg  1 lozenge Oral PRN Durene Romans, MD       Or  . phenol (CHLORASEPTIC) mouth spray 1 spray  1 spray Mouth/Throat PRN Durene Romans, MD      . metoCLOPramide (REGLAN) tablet 5-10 mg  5-10 mg Oral Q8H PRN Durene Romans, MD       Or  . metoCLOPramide (REGLAN) injection 5-10 mg  5-10 mg Intravenous Q8H PRN Durene Romans, MD   10 mg at 11/10/15 0243  . morphine 2 MG/ML injection 0.5 mg  0.5 mg Intravenous Q2H PRN Eduard Clos, MD   0.5 mg at 11/09/15 1610  . ondansetron  (ZOFRAN) tablet 4 mg  4 mg Oral Q6H PRN Durene Romans, MD       Or  . ondansetron [facility]) injection 4 mg  4 mg Intravenous Q6H PRN Durene Romans, MD      . polyethylene glycol (MIRALAX / GLYCOLAX) packet 17 g  17 g Oral Daily PRN Durene Romans, MD      . rosuvastatin (CRESTOR) tablet 10 mg  10 mg Oral Daily Eduard Clos, MD   10 mg at 11/10/15 0846  . traMADol (ULTRAM) tablet 50-100 mg  50-100 mg Oral Q6H PRN Durene Romans, MD         Discharge Medications: Please see discharge summary for a list of discharge medications.  Relevant Imaging Results:  Relevant Lab Results:   Additional Information SSN [id number]  Darleene Cleaver, [place]

## 2015-11-10 NOTE — Clinical Social Work Note (Signed)
Clinical Social Work Assessment  Patient Details  Name: Natasha Beasley MRN: [id number] Date of Birth: 08/24/1925  Date of referral:  11/10/15               Reason for consult:  Facility Placement                Permission sought to share information with:  Facility Medical sales representative, Family Supports Permission granted to share information::  Yes, Verbal Permission Granted  Name::     [name], [name] [phone] or [name], [name] [phone]  Agency::  SNF admissions  Relationship::     Contact Information:     Housing/Transportation Living arrangements for the past 2 months:  Single Family Home Source of Information:  Patient, Adult Children Patient Interpreter Needed:  None Criminal Activity/Legal Involvement Pertinent to Current Situation/Hospitalization:  No - Comment as needed Significant Relationships:  Adult Children Lives with:  Self Do you feel safe going back to the place where you live?  No (Patient feels she needs some short term rehab before she can return back home) Need for family participation in patient care:  Yes (Comment)  Care giving concerns:  Patient lives alone and feel she needs some short term rehab before she can return back home.   Social Worker assessment / plan:  Patient is a 82 year old female who was quite active according to patient's son.  Patient is alert and oriented x3 and son is hte main decision maker for patient.  Patient's son states patient was driving still before she became sick and still volunteered at [facility].  Patient's son expressed that patient has been to rehab in the past, but it was about 6 years ago.  CSW explained to patient and family about the SNF search process, and what to expect.  Patient and son express they would like her to go to [facility].  CSW explained to patient and her son that if [facility] can not take patient CSW will look for other placement.  Patient's family agreed, and they  did not express any other questions.  Employment status:  Retired Health and safety inspector:  Medicare PT Recommendations:  Skilled Nursing Facility Information / Referral to community resources:  Skilled Nursing Facility  Patient/Family's Response to care:  Patient and family agreeable to going to SNF for short term rehab.  Patient/Family's Understanding of and Emotional Response to Diagnosis, Current Treatment, and Prognosis:  Patient and family aware of current treatment plan and prognosis.  Emotional Assessment Appearance:  Appears stated age Attitude/Demeanor/Rapport:    Affect (typically observed):  Appropriate, Quiet, Stable, Calm Orientation:  Oriented to Place, Oriented to Situation, Oriented to Self Alcohol / Substance use:  Not Applicable Psych involvement (Current and /or in the community):  No (Comment)  Discharge Needs  Concerns to be addressed:  Lack of Support Readmission within the last 30 days:  No Current discharge risk:  Lives alone Barriers to Discharge:  No Barriers Identified   [place] Constable 11/10/2015, 5:47 PM

## 2015-11-10 NOTE — Progress Notes (Signed)
Patient ID: Natasha Beasley, female   DOB: 05-09-1925, 82 y.o.   MRN: [id number] Subjective: 1 Day Post-Op Procedure(s) (LRB): CANNULATED HIP PINNING (Right)    Patient continues to be confused.  No events over night  Objective:   VITALS:   Filed Vitals:   11/09/15 2230 11/10/15 0238  BP: 155/54 123/80  Pulse: 87 96  Temp: 97.6 F (36.4 C)   Resp: 19     Neurovascular intact - moving legs in bed with some pain Incision: dressing C/D/I  LABS  Recent Labs  11/08/15 1908 11/09/15 0456  HGB 11.6* 10.8*  HCT 36.5 34.4*  WBC 9.9 7.9  PLT 226 193     Recent Labs  11/08/15 1908 11/09/15 0456  NA 129* 140  K 4.2 4.2  BUN 15 17  CREATININE 1.46* 1.53*  GLUCOSE 129* 124*     Recent Labs  11/08/15 1908  INR 1.14     Assessment/Plan: 1 Day Post-Op Procedure(s) (LRB): CANNULATED HIP PINNING (Right)   Advance diet - as tolerable per medicine, careful if not fully cognizant  Up with therapy - PWB RLE Discharge to SNF most likely when stable  RTC in 2 weeks for wound check and X-rays

## 2015-11-10 NOTE — Progress Notes (Signed)
Orthopedic Tech Progress Note Patient Details:  Natasha Beasley 06/05/1925 [id number]  Ortho Devices Ortho Device/Splint Location: Trapeze bar Ortho Device/Splint Interventions: Application   Saul Fordyce 11/10/2015, 10:08 AM

## 2015-11-11 DIAGNOSIS — W19XXXA Unspecified fall, initial encounter: Secondary | ICD-10-CM | POA: Diagnosis present

## 2015-11-11 DIAGNOSIS — F329 Major depressive disorder, single episode, unspecified: Secondary | ICD-10-CM | POA: Diagnosis present

## 2015-11-11 DIAGNOSIS — N390 Urinary tract infection, site not specified: Secondary | ICD-10-CM | POA: Diagnosis not present

## 2015-11-11 DIAGNOSIS — T8489XA Other specified complication of internal orthopedic prosthetic devices, implants and grafts, initial encounter: Secondary | ICD-10-CM | POA: Diagnosis not present

## 2015-11-11 DIAGNOSIS — Z79899 Other long term (current) drug therapy: Secondary | ICD-10-CM | POA: Diagnosis not present

## 2015-11-11 DIAGNOSIS — E039 Hypothyroidism, unspecified: Secondary | ICD-10-CM | POA: Diagnosis not present

## 2015-11-11 DIAGNOSIS — F0281 Dementia in other diseases classified elsewhere with behavioral disturbance: Secondary | ICD-10-CM | POA: Diagnosis not present

## 2015-11-11 DIAGNOSIS — Z96642 Presence of left artificial hip joint: Secondary | ICD-10-CM | POA: Diagnosis not present

## 2015-11-11 DIAGNOSIS — Z7982 Long term (current) use of aspirin: Secondary | ICD-10-CM | POA: Diagnosis not present

## 2015-11-11 DIAGNOSIS — R52 Pain, unspecified: Secondary | ICD-10-CM | POA: Diagnosis not present

## 2015-11-11 DIAGNOSIS — R110 Nausea: Secondary | ICD-10-CM | POA: Diagnosis not present

## 2015-11-11 DIAGNOSIS — Z96649 Presence of unspecified artificial hip joint: Secondary | ICD-10-CM | POA: Diagnosis present

## 2015-11-11 DIAGNOSIS — S72001K Fracture of unspecified part of neck of right femur, subsequent encounter for closed fracture with nonunion: Secondary | ICD-10-CM | POA: Diagnosis not present

## 2015-11-11 DIAGNOSIS — E7800 Pure hypercholesterolemia, unspecified: Secondary | ICD-10-CM | POA: Diagnosis present

## 2015-11-11 DIAGNOSIS — M96661 Fracture of femur following insertion of orthopedic implant, joint prosthesis, or bone plate, right leg: Secondary | ICD-10-CM | POA: Diagnosis not present

## 2015-11-11 DIAGNOSIS — Z96641 Presence of right artificial hip joint: Secondary | ICD-10-CM | POA: Diagnosis not present

## 2015-11-11 DIAGNOSIS — Z7901 Long term (current) use of anticoagulants: Secondary | ICD-10-CM | POA: Diagnosis not present

## 2015-11-11 DIAGNOSIS — S72041K Displaced fracture of base of neck of right femur, subsequent encounter for closed fracture with nonunion: Secondary | ICD-10-CM | POA: Diagnosis not present

## 2015-11-11 DIAGNOSIS — D649 Anemia, unspecified: Secondary | ICD-10-CM | POA: Diagnosis not present

## 2015-11-11 DIAGNOSIS — N179 Acute kidney failure, unspecified: Secondary | ICD-10-CM | POA: Diagnosis not present

## 2015-11-11 DIAGNOSIS — S72001A Fracture of unspecified part of neck of right femur, initial encounter for closed fracture: Secondary | ICD-10-CM | POA: Diagnosis present

## 2015-11-11 DIAGNOSIS — F419 Anxiety disorder, unspecified: Secondary | ICD-10-CM | POA: Diagnosis present

## 2015-11-11 DIAGNOSIS — S72009A Fracture of unspecified part of neck of unspecified femur, initial encounter for closed fracture: Secondary | ICD-10-CM | POA: Diagnosis not present

## 2015-11-11 DIAGNOSIS — M6281 Muscle weakness (generalized): Secondary | ICD-10-CM | POA: Diagnosis not present

## 2015-11-11 DIAGNOSIS — I129 Hypertensive chronic kidney disease with stage 1 through stage 4 chronic kidney disease, or unspecified chronic kidney disease: Secondary | ICD-10-CM | POA: Diagnosis present

## 2015-11-11 DIAGNOSIS — E785 Hyperlipidemia, unspecified: Secondary | ICD-10-CM | POA: Diagnosis not present

## 2015-11-11 DIAGNOSIS — Z471 Aftercare following joint replacement surgery: Secondary | ICD-10-CM | POA: Diagnosis not present

## 2015-11-11 DIAGNOSIS — S72001D Fracture of unspecified part of neck of right femur, subsequent encounter for closed fracture with routine healing: Secondary | ICD-10-CM | POA: Diagnosis not present

## 2015-11-11 DIAGNOSIS — R262 Difficulty in walking, not elsewhere classified: Secondary | ICD-10-CM | POA: Diagnosis not present

## 2015-11-11 DIAGNOSIS — F0390 Unspecified dementia without behavioral disturbance: Secondary | ICD-10-CM | POA: Diagnosis present

## 2015-11-11 DIAGNOSIS — M25551 Pain in right hip: Secondary | ICD-10-CM | POA: Diagnosis present

## 2015-11-11 DIAGNOSIS — Z4789 Encounter for other orthopedic aftercare: Secondary | ICD-10-CM | POA: Diagnosis not present

## 2015-11-11 DIAGNOSIS — Z9181 History of falling: Secondary | ICD-10-CM | POA: Diagnosis not present

## 2015-11-11 DIAGNOSIS — I4891 Unspecified atrial fibrillation: Secondary | ICD-10-CM | POA: Diagnosis not present

## 2015-11-11 DIAGNOSIS — I10 Essential (primary) hypertension: Secondary | ICD-10-CM | POA: Diagnosis not present

## 2015-11-11 DIAGNOSIS — N183 Chronic kidney disease, stage 3 (moderate): Secondary | ICD-10-CM | POA: Diagnosis present

## 2015-11-11 DIAGNOSIS — Y9222 Religious institution as the place of occurrence of the external cause: Secondary | ICD-10-CM | POA: Diagnosis not present

## 2015-11-11 DIAGNOSIS — R278 Other lack of coordination: Secondary | ICD-10-CM | POA: Diagnosis not present

## 2015-11-11 DIAGNOSIS — R1312 Dysphagia, oropharyngeal phase: Secondary | ICD-10-CM | POA: Diagnosis not present

## 2015-11-11 DIAGNOSIS — R41841 Cognitive communication deficit: Secondary | ICD-10-CM | POA: Diagnosis present

## 2015-11-11 MED ORDER — HYDROCODONE-ACETAMINOPHEN 5-325 MG PO TABS: 1.0000 | ORAL_TABLET | Freq: Four times a day (QID) | ORAL | Status: DC | PRN

## 2015-11-11 NOTE — Care Management Note (Signed)
Case Management Note  Patient Details  Name: Natasha Beasley MRN: [id number] Date of Birth: June 08, 1925  Subjective/Objective:        Admitted with right femoral neck fracture, s/p screw fixation            Action/Plan: PT recommended SNF. Referral made to CSW. Per CSW patient to discharge to [facility].  Expected Discharge Date:       11/11/15           Expected Discharge Plan:  Skilled Nursing Facility  In-House Referral:  Clinical Social Work  Discharge planning Services  CM Consult  Post Acute Care Choice:    Choice offered to:     DME Arranged:    DME Agency:     HH Arranged:    HH Agency:     Status of Service:  Completed, signed off  Medicare Important Message Given:    Date Medicare IM Given:    Medicare IM give by:    Date Additional Medicare IM Given:    Additional Medicare Important Message give by:     If discussed at Long Length of Stay Meetings, dates discussed:    Additional Comments:  Monica Becton, RN 11/11/2015, 10:14 AM

## 2015-11-11 NOTE — Discharge Summary (Signed)
Physician Discharge Summary  Natasha Beasley MHW:[id number] DOB: 1924-10-02 DOA: 11/08/2015  PCP: Limmie Patricia, MD  Admit date: 11/08/2015 Discharge date: 11/11/2015  Time spent: 20 minutes  Recommendations for Outpatient Follow-up:  1. Follow up with PCP in 1-2 weeks 2. Please monitor urine output to ensure good flow 3. Please ensure good PO intake, assist with feeding if needed 4. Consider rechecking renal panel in 1-2 weeks to ensure patient is tolerating ARB  Discharge Diagnoses:  Principal Problem:   Closed right hip fracture (Natasha Beasley) Active Problems:   ARF (acute renal failure) ([place])   Hypertension   HLD (hyperlipidemia)   Hypothyroidism   Hip fracture [facility])   Discharge Condition: Stable  Diet recommendation: Heart healthy  Filed Weights   11/08/15 1454 11/08/15 2300  Weight: 63.504 kg (140 lb) 68.5 kg (151 lb 0.2 oz)    History of present illness:  Please review dictated H and P from 2/19 for details. Briefly, 82 y.o. female with history of hypertension, hypothyroidism, hyperlipidemia [place] the [employer] with resultant R hip fracture.  Hospital Course:   Right hip fracture status post mechanical fall - patient noted to be at moderate risk for intermediate risk procedure. Orthopedic surgery was consulted and patient underwent pinning of R hip on 2/20. Therapy recommendations for SNF  Acute renal failure with hyponatremia - held off Cozaar and hydrochlorothiazide and gently hydrated. Renal function has improved nicely. Recommend resuming home meds on discharge. Recommend ensuring good PO intake especially while taking bp meds  Hypertension - Cozaar and hydrochlorothiazide prior to admit, held briefly secondary to ARF,  BP remains stable thus far. Recommend resuming only ARB on discharge and stop HCTZ given concerns of developing ARF down the road (see below). Consider repeating renal panel in 1-2 weeks  Hyperlipidemia - continue on  statins.  Hypothyroidism on Synthroid.  Mild anemia - remained stable  Likely Underlying Dementia - Met with son at bedside. Son describes history of marked short term memory loss and forgetfulness. Long-term memory seems intact. PCP has been considering dementia meds as outpatient. Given possible dementia, patient is at high risk for decreased PO intake down the road leading to ARF. As such, recommend stopping HCTZ and ENCOURAGE PROPER PO INTAKE. Son is made aware of this  Procedures:  Pinning of R hip on 2/20  Consultations:  Orthopedic Surgery  Discharge Exam: Filed Vitals:   11/10/15 0238 11/10/15 1324 11/10/15 2054 11/11/15 0500  BP: 123/80 121/50 140/53 146/69  Pulse: 96 79 81 80  Temp:  97.8 F (36.6 C) 97.3 F (36.3 C) 97.4 F (36.3 C)  TempSrc:  Oral Oral Oral  Resp:  _0 Height:      Weight:      SpO2: 92% 90% 93% 94%    General: Awake, in nad Cardiovascular: regular, s1, s2 Respiratory: normal resp effort, no wheezing  Discharge Instructions      Discharge Instructions    Partial weight bearing    Complete by:  As directed   % Body Weight:  50  Laterality:  right  Extremity:  Lower            Medication List    STOP taking these medications        hydrochlorothiazide 25 MG tablet  Commonly known as:  HYDRODIURIL      TAKE these medications        aspirin 325 MG EC tablet  Take 1 tablet (325 mg total) by mouth 2 (  two) times daily.     atenolol 50 MG tablet  Commonly known as:  TENORMIN  Take 50 mg by mouth daily.     ergocalciferol 50000 units capsule  Commonly known as:  VITAMIN D2  Take 50,000 Units by mouth every 14 (fourteen) days. Every other sunday     escitalopram 10 MG tablet  Commonly known as:  LEXAPRO  Take 10 mg by mouth daily.     HYDROcodone-acetaminophen 5-325 MG tablet  Commonly known as:  NORCO/VICODIN  Take 1-2 tablets by mouth every 6 (six) hours as needed for moderate pain.     levothyroxine 100 MCG  tablet  Commonly known as:  SYNTHROID, LEVOTHROID  Take 100 mcg by mouth daily. On an empty stomach     losartan 25 MG tablet  Commonly known as:  COZAAR  Take 25 mg by mouth daily.     ondansetron 4 MG tablet  Commonly known as:  ZOFRAN  Take 4 mg by mouth every 8 (eight) hours as needed for nausea or vomiting. Reported on 11/08/2015     rosuvastatin 10 MG tablet  Commonly known as:  CRESTOR  Take 10 mg by mouth daily.       No Known Allergies Follow-up Information    Follow up with Mauri Pole, MD. Schedule an appointment as soon as possible for a visit in 2 weeks.   Specialty:  Orthopedic Surgery   Contact information:   [address] [place] [address] [phone]        The results of significant diagnostics from this hospitalization (including imaging, microbiology, ancillary and laboratory) are listed below for reference.    Significant Diagnostic Studies: Dg Chest Port 1 View  11/08/2015  CLINICAL DATA:  Pre-op chest exam; right hip fracture; h/o HTN EXAM: PORTABLE CHEST 1 VIEW COMPARISON:  02/03/2010 FINDINGS: Lungs are hyperinflated. There is perihilar peribronchial thickening. There are no focal consolidations or pleural effusions. No pulmonary edema. IMPRESSION: Bronchitic changes. Electronically Signed   By: Nolon Nations M.D.   On: 11/08/2015 17:50   Dg Hip Operative Unilat With Pelvis Right  11/09/2015  CLINICAL DATA:  Status post hip fracture fixation. EXAM: OPERATIVE right HIP (WITH PELVIS IF PERFORMED) 2 VIEWS TECHNIQUE: Fluoroscopic spot image(s) were submitted for interpretation post-operatively. COMPARISON:  Radiographs 11/08/2015 FINDINGS: There are 3 cannulated hip screws transfixing the knee subcapital hip fracture. The screws are in good position. No complicating features are demonstrated. IMPRESSION: Internal fixation of subcapital hip fracture with 3 cannulated hip screws. Electronically Signed   By: Marijo Sanes M.D.   On:  11/09/2015 21:30   Dg Hip Unilat With Pelvis 2-3 Views Right  11/08/2015  CLINICAL DATA:  Status post trip and fall today with a right hip injury. Pain. Initial encounter. EXAM: DG HIP (WITH OR WITHOUT PELVIS) 2-3V RIGHT COMPARISON:  None. FINDINGS: There is an acute mildly impacted subcapital fracture of the right hip. No other acute bony or joint abnormality is identified. Left hip replacement is noted. Bones are osteopenic. IMPRESSION: Acute mildly impacted subcapital fracture right hip. Osteopenia. Electronically Signed   By: Inge Rise M.D.   On: 11/08/2015 16:25    Microbiology: Recent Results (from the past 240 hour(s))  Surgical PCR screen     Status: None   Collection Time: 11/09/15  6:50 AM  Result Value Ref Range Status   MRSA, PCR NEGATIVE NEGATIVE Final   Staphylococcus aureus NEGATIVE NEGATIVE Final    Comment:  The Xpert SA Assay (FDA approved for NASAL specimens in patients over 40 years of age), is one component of a comprehensive surveillance program.  Test performance has been validated by [facility] for patients greater than or equal to 96 year old. It is not intended to diagnose infection nor to guide or monitor treatment.      Labs: Basic Metabolic Panel:  Recent Labs Lab 11/08/15 1908 11/09/15 0456 11/10/15 0739  NA 129* 140 136  K 4.2 4.2 3.6  CL 97* 103 103  CO2 20* 24 23  GLUCOSE 129* 124* 151*  BUN _0 CREATININE 1.46* 1.53* 1.15*  CALCIUM 8.8* 9.2 8.7*   Liver Function Tests:  Recent Labs Lab 11/09/15 0456  AST 19  ALT 10*  ALKPHOS 53  BILITOT 0.6  PROT 6.6  ALBUMIN 3.4*   No results for input(s): LIPASE, AMYLASE in the last 168 hours. No results for input(s): AMMONIA in the last 168 hours. CBC:  Recent Labs Lab 11/08/15 1908 11/09/15 0456  WBC 9.9 7.9  NEUTROABS 8.4* 6.5  HGB 11.6* 10.8*  HCT 36.5 34.4*  MCV 89.9 90.3  PLT 226 193   Cardiac Enzymes: No results for input(s): CKTOTAL, CKMB,  CKMBINDEX, TROPONINI in the last 168 hours. BNP: BNP (last 3 results) No results for input(s): BNP in the last 8760 hours.  ProBNP (last 3 results) No results for input(s): PROBNP in the last 8760 hours.  CBG: No results for input(s): GLUCAP in the last 168 hours.   Signed:  , Orpah Melter  [facility] 11/11/2015, 9:14 AM

## 2015-11-11 NOTE — Clinical Social Work Note (Signed)
Patient to be d/c'ed today to [facility].  Patient and family agreeable to plans will transport via ems RN to call report to [phone].  Windell Moulding, MSW, Theresia Majors [phone]

## 2015-11-11 NOTE — Care Management Important Message (Signed)
Important Message  Patient Details  Name: Natasha Beasley MRN: [id number] Date of Birth: 27-Dec-1924   Medicare Important Message Given:  Yes    Hawraa Stambaugh P Sharonna Vinje 11/11/2015, 1:53 PM

## 2015-11-11 NOTE — Progress Notes (Signed)
OT Cancellation Note  Patient Details Name: Stasha Naraine MRN: [id number] DOB: 03/24/1925   Cancelled Treatment:    Reason Eval/Treat Not Completed: Other (comment) Pt is Medicare and current D/C plan is SNF. No apparent immediate acute care OT needs, therefore will defer OT to SNF. If OT eval is needed please call Acute Rehab Dept. at [phone] or text page OT at [phone].    Evette Georges [phone] 11/11/2015, 7:26 AM

## 2015-11-11 NOTE — Progress Notes (Signed)
Physical Therapy Treatment Patient Details Name: Natasha Beasley MRN: [id number] DOB: [birth year] Today's Date: 11/11/2015    History of Present Illness Patient is an 81 yo female admitted 11/08/15 after a fall resulting in Rt hip fracture.  Patient s/p cannulated hip pinning on 11/09/15.    PMH:  HTN, anxiety, depression, Lt THA, short term memory loss (per son)    PT Comments    Patient limited by cognitive deficits and pain this session. Pt unable to follow one step commands consistently. Unable to stand and maintain PWB status. Current plan remains appropriate.   Follow Up Recommendations  SNF;Supervision/Assistance - 24 hour     Equipment Recommendations  None recommended by PT    Recommendations for Other Services       Precautions / Restrictions Precautions Precautions: Fall Restrictions Weight Bearing Restrictions: Yes RLE Weight Bearing: Partial weight bearing RLE Partial Weight Bearing Percentage or Pounds:  (50% WB)    Mobility  Bed Mobility Overal bed mobility: Needs Assistance Bed Mobility: Supine to Sit;Sit to Supine     Supine to sit: Max assist Sit to supine: Max assist;+2 for physical assistance   General bed mobility comments: assist to bring R LE to EOB, elevate trunk into sitting, and scoot hips forward to EOB with use of bed pad; assist to get supine and total assist +2 to scoot up in bed with use of bed pads; pt unable to folllow commands  Transfers Overall transfer level: Needs assistance Equipment used: Rolling walker (2 wheeled) Transfers: Sit to/from Stand Sit to Stand: Max assist;+2 physical assistance;+2 safety/equipment         General transfer comment: attempted sit to stand but unable to stand due to pt not following commands and unable to maintain WB status; verbal and tactile cues for hand placement, technique, and WB status    Ambulation/Gait                 Stairs            Wheelchair Mobility    Modified Rankin  (Stroke Patients Only)       Balance Overall balance assessment: Needs assistance Sitting-balance support: Bilateral upper extremity supported;Feet supported Sitting balance-Leahy Scale: Poor                              Cognition Arousal/Alertness: Awake/alert Behavior During Therapy: WFL for tasks assessed/performed Overall Cognitive Status: History of cognitive impairments - at baseline (Per son, patient at baseline) Area of Impairment: Orientation;Memory;Awareness;Safety/judgement;Problem solving Orientation Level: Disoriented to;Place;Time;Situation   Memory: Decreased short-term memory   Safety/Judgement: Decreased awareness of deficits;Decreased awareness of safety   Problem Solving: Slow processing;Difficulty sequencing;Requires verbal cues General Comments: pt spoke about work  throughout session and reminded that she was in hospital; son reported pt has not worked since 1991; pt reported that she fell and broke her hip 2 years ago    Exercises      General Comments General comments (skin integrity, edema, etc.): pt confused and unable to follow one step commands 75% time       Pertinent Vitals/Pain Pain Assessment: Faces Faces Pain Scale: Hurts even more Pain Location: R hip with mobility Pain Descriptors / Indicators: Grimacing;Guarding Pain Intervention(s): Limited activity within patient's tolerance;Monitored during session;Premedicated before session;Repositioned    Home Living                      Prior Function  PT Goals (current goals can now be found in the care plan section) Acute Rehab PT Goals Patient Stated Goal: None stated PT Goal Formulation: With patient/family Time For Goal Achievement: 11/24/15 Potential to Achieve Goals: Good Progress towards PT goals: Not progressing toward goals - comment (cognitive deficits limiting participation)    Frequency  Min 5X/week    PT Plan Current plan remains  appropriate    Co-evaluation             End of Session Equipment Utilized During Treatment: Gait belt Activity Tolerance: Patient tolerated treatment well Patient left: with call bell/phone within reach;with family/visitor present;in bed;with bed alarm set     Time: 9604-5409 PT Time Calculation (min) (ACUTE ONLY): 21 min  Charges:  $Therapeutic Activity: 8-22 mins                    G Codes:      Derek Mound, PTA Pager: [phone]   11/11/2015, 3:28 PM

## 2015-11-13 ENCOUNTER — Encounter (HOSPITAL_COMMUNITY): Payer: Self-pay | Admitting: Orthopedic Surgery

## 2015-11-23 DIAGNOSIS — Z4789 Encounter for other orthopedic aftercare: Secondary | ICD-10-CM | POA: Diagnosis not present

## 2015-11-26 NOTE — Progress Notes (Signed)
Called and spoke with Mor[facility] at [facility].  She is to fax current MAR and POA and face sheet information.

## 2015-12-04 ENCOUNTER — Encounter (HOSPITAL_COMMUNITY): Payer: Self-pay | Admitting: *Deleted

## 2015-12-04 NOTE — Progress Notes (Signed)
Spoke with son, Chanetta MarshallJimmy , and he plans to be here for surgery on 12/07/2015.  He is aware of arrival time for patient and time of surgery.  [facility] and she has received preop instructions for surgery.  Received and placed on chart [facility] 12[facility]/2015 and faxed to Dr Charlann BoxerLin abnormal U/A done on 12/03/2015.

## 2015-12-06 NOTE — H&P (Signed)
Natasha Beasley is an 82 y.o. female.    Chief Complaint:   Failed previous right hip surgery  Procedure:     Conversion from previous right hip surgery to total hip arthroplasty   HPI: Natasha Beasley is a 82 y.o. female complaining of right hip pain.   Natasha Beasley had a previous surgery to repair a fracture with percutaneous cannulated screw fixation.  Natasha Beasley had pain since the beginning, but did fall back in February 2017 while leaving church.  Natasha Beasley states increasing pain since that time.      Pain had continually increased since the beginning. X-rays in the clinic show her fracture to be displaced into a varus deformity, significant shortening is already noted of the fracture and the one most superior screws about to cut out of the femoral head. Natasha Beasley has tried various conservative treatments which have failed to alleviate their symptoms. Various options are discussed with the patient. Risks, benefits and expectations were discussed with the patient. Patient understand the risks, benefits and expectations and wishes to proceed with surgery.    PCP: Junious Silk, MD  D/C Plans:      Home with HHPT/SNF  Post-op Meds:       No Rx given  Tranexamic Acid:      To be given - IV  Decadron:      Is to be given  FYI:     ASA  Norco   PMH: Past Medical History  Diagnosis Date  . Hypertension   . Hypercholesteremia   . Thyroid disease   . Anxiety   . Depression   . Fracture of right femur (HCC)     neck of right femur   . Hx of falling   . Hypothyroidism   . Chronic kidney disease     chronic kidney disease stage III  . Anemia   . Muscle weakness   . Other lack of coordination   . Difficulty in walking   . Dysphagia     oropharyngeal phase  . Cognitive communication deficit   . Presence of left artificial hip joint   . Dementia   . Nausea     PSH: Past Surgical History  Procedure Laterality Date  . Hip surgery    . Hip pinning,cannulated Right 11/09/2015    Procedure: CANNULATED HIP  PINNING;  Surgeon: Durene Romans, MD;  Location: [facility] OR;  Service: Orthopedics;  Laterality: Right;    Social History:  reports that Natasha Beasley has never smoked. Natasha Beasley does not have any smokeless tobacco history on file. Natasha Beasley reports that Natasha Beasley does not drink alcohol or use illicit drugs.  Allergies:  No Known Allergies  Medications: No current facility-administered medications for this encounter.   Current Outpatient Prescriptions  Medication Sig Dispense Refill  . acetaminophen (TYLENOL) 325 MG tablet Take 650 mg by mouth 3 (three) times daily.    Marland Kitchen aspirin EC 325 MG EC tablet Take 1 tablet (325 mg total) by mouth 2 (two) times daily. 60 tablet 0  . atenolol (TENORMIN) 50 MG tablet Take 50 mg by mouth daily.      . cholecalciferol (VITAMIN D) 1000 units tablet Take 1,000 Units by mouth daily.    Marland Kitchen escitalopram (LEXAPRO) 10 MG tablet Take 10 mg by mouth daily.      Marland Kitchen HYDROcodone-acetaminophen (NORCO/VICODIN) 5-325 MG tablet Take 1-2 tablets by mouth every 6 (six) hours as needed for moderate pain. 20 tablet 0  . levothyroxine (SYNTHROID, LEVOTHROID) 100 MCG tablet Take 100 mcg by  mouth daily. On an empty stomach     . LORazepam (ATIVAN) 1 MG tablet Take 1 mg by mouth every 6 (six) hours as needed for anxiety.    Marland Kitchen. losartan (COZAAR) 25 MG tablet Take 25 mg by mouth daily.    Marland Kitchen. senna-docusate (SENOKOT-S) 8.6-50 MG tablet Take 1 tablet by mouth daily as needed for mild constipation.        Review of Systems  Constitutional: Negative.   HENT: Negative.   Eyes: Negative.   Respiratory: Negative.   Cardiovascular: Negative.   Gastrointestinal: Negative.   Genitourinary: Negative.   Musculoskeletal: Positive for joint pain.  Skin: Negative.   Neurological: Negative.   Endo/Heme/Allergies: Negative.   Psychiatric/Behavioral: Positive for depression and memory loss. The patient is nervous/anxious.        Physical Exam  Constitutional: Natasha Beasley is oriented to person, place, and time. Natasha Beasley appears  well-developed.  HENT:  Head: Normocephalic.  Eyes: Pupils are equal, round, and reactive to light.  Neck: Neck supple. No JVD present. No tracheal deviation present. No thyromegaly present.  Cardiovascular: Normal rate, regular rhythm, normal heart sounds and intact distal pulses.   Respiratory: Effort normal and breath sounds normal. No stridor. No respiratory distress. Natasha Beasley has no wheezes.  GI: Soft. There is no tenderness. There is no guarding.  Musculoskeletal:       Right hip: Natasha Beasley exhibits decreased range of motion, decreased strength, tenderness, bony tenderness and laceration (healed previous incision). Natasha Beasley exhibits no crepitus and no deformity.  Lymphadenopathy:    Natasha Beasley has no cervical adenopathy.  Neurological: Natasha Beasley is alert and oriented to person, place, and time.  Skin: Skin is warm and dry.  Psychiatric: Natasha Beasley has a normal mood and affect.     Assessment/Plan Assessment:    Failed previous right hip surgery  Plan: Patient will undergo a conversion from previous right hip surgery to total hip arthroplasty on 12/07/2015 per [facility]. Risks benefits and expectations were discussed with the patient. Patient understand risks, benefits and expectations and wishes to proceed.      Anastasio AuerbachMatthew S. Zen Cedillos   PA-C  12/06/2015, 10:26 PM

## 2015-12-07 ENCOUNTER — Inpatient Hospital Stay (HOSPITAL_COMMUNITY)
Admission: RE | Admit: 2015-12-07 | Discharge: 2015-12-09 | DRG: 470 | Disposition: A | Discharge status: Skilled Nursing Facility | Payer: Medicare Other | Source: Ambulatory Visit | Attending: Orthopedic Surgery | Admitting: Orthopedic Surgery

## 2015-12-07 ENCOUNTER — Inpatient Hospital Stay (HOSPITAL_COMMUNITY): Payer: Medicare Other | Admitting: Anesthesiology

## 2015-12-07 ENCOUNTER — Encounter (HOSPITAL_COMMUNITY)
Admission: RE | Disposition: A | Discharge status: Skilled Nursing Facility | Payer: Self-pay | Source: Ambulatory Visit | Attending: Orthopedic Surgery

## 2015-12-07 ENCOUNTER — Encounter (HOSPITAL_COMMUNITY): Payer: Self-pay | Admitting: *Deleted

## 2015-12-07 ENCOUNTER — Inpatient Hospital Stay (HOSPITAL_COMMUNITY): Payer: Medicare Other

## 2015-12-07 DIAGNOSIS — I10 Essential (primary) hypertension: Secondary | ICD-10-CM | POA: Diagnosis not present

## 2015-12-07 DIAGNOSIS — M96661 Fracture of femur following insertion of orthopedic implant, joint prosthesis, or bone plate, right leg: Secondary | ICD-10-CM | POA: Diagnosis not present

## 2015-12-07 DIAGNOSIS — S72001A Fracture of unspecified part of neck of right femur, initial encounter for closed fracture: Secondary | ICD-10-CM | POA: Diagnosis not present

## 2015-12-07 DIAGNOSIS — M25551 Pain in right hip: Secondary | ICD-10-CM | POA: Diagnosis not present

## 2015-12-07 DIAGNOSIS — S72001D Fracture of unspecified part of neck of right femur, subsequent encounter for closed fracture with routine healing: Secondary | ICD-10-CM | POA: Diagnosis not present

## 2015-12-07 DIAGNOSIS — Z79899 Other long term (current) drug therapy: Secondary | ICD-10-CM

## 2015-12-07 DIAGNOSIS — Z96649 Presence of unspecified artificial hip joint: Secondary | ICD-10-CM | POA: Diagnosis present

## 2015-12-07 DIAGNOSIS — I129 Hypertensive chronic kidney disease with stage 1 through stage 4 chronic kidney disease, or unspecified chronic kidney disease: Secondary | ICD-10-CM | POA: Diagnosis present

## 2015-12-07 DIAGNOSIS — E039 Hypothyroidism, unspecified: Secondary | ICD-10-CM | POA: Diagnosis present

## 2015-12-07 DIAGNOSIS — R41841 Cognitive communication deficit: Secondary | ICD-10-CM | POA: Diagnosis present

## 2015-12-07 DIAGNOSIS — F419 Anxiety disorder, unspecified: Secondary | ICD-10-CM | POA: Diagnosis present

## 2015-12-07 DIAGNOSIS — S72009A Fracture of unspecified part of neck of unspecified femur, initial encounter for closed fracture: Secondary | ICD-10-CM | POA: Diagnosis not present

## 2015-12-07 DIAGNOSIS — R262 Difficulty in walking, not elsewhere classified: Secondary | ICD-10-CM | POA: Diagnosis not present

## 2015-12-07 DIAGNOSIS — Y9222 Religious institution as the place of occurrence of the external cause: Secondary | ICD-10-CM

## 2015-12-07 DIAGNOSIS — Z7982 Long term (current) use of aspirin: Secondary | ICD-10-CM | POA: Diagnosis not present

## 2015-12-07 DIAGNOSIS — E7800 Pure hypercholesterolemia, unspecified: Secondary | ICD-10-CM | POA: Diagnosis present

## 2015-12-07 DIAGNOSIS — M6281 Muscle weakness (generalized): Secondary | ICD-10-CM | POA: Diagnosis not present

## 2015-12-07 DIAGNOSIS — R110 Nausea: Secondary | ICD-10-CM | POA: Diagnosis not present

## 2015-12-07 DIAGNOSIS — S72041K Displaced fracture of base of neck of right femur, subsequent encounter for closed fracture with nonunion: Secondary | ICD-10-CM | POA: Diagnosis not present

## 2015-12-07 DIAGNOSIS — F0390 Unspecified dementia without behavioral disturbance: Secondary | ICD-10-CM | POA: Diagnosis present

## 2015-12-07 DIAGNOSIS — W19XXXA Unspecified fall, initial encounter: Secondary | ICD-10-CM | POA: Diagnosis present

## 2015-12-07 DIAGNOSIS — R278 Other lack of coordination: Secondary | ICD-10-CM | POA: Diagnosis not present

## 2015-12-07 DIAGNOSIS — E785 Hyperlipidemia, unspecified: Secondary | ICD-10-CM | POA: Diagnosis not present

## 2015-12-07 DIAGNOSIS — Z471 Aftercare following joint replacement surgery: Secondary | ICD-10-CM | POA: Diagnosis not present

## 2015-12-07 DIAGNOSIS — R52 Pain, unspecified: Secondary | ICD-10-CM | POA: Diagnosis not present

## 2015-12-07 DIAGNOSIS — N183 Chronic kidney disease, stage 3 (moderate): Secondary | ICD-10-CM | POA: Diagnosis present

## 2015-12-07 DIAGNOSIS — R1312 Dysphagia, oropharyngeal phase: Secondary | ICD-10-CM | POA: Diagnosis not present

## 2015-12-07 DIAGNOSIS — F0281 Dementia in other diseases classified elsewhere with behavioral disturbance: Secondary | ICD-10-CM | POA: Diagnosis not present

## 2015-12-07 DIAGNOSIS — S72001K Fracture of unspecified part of neck of right femur, subsequent encounter for closed fracture with nonunion: Secondary | ICD-10-CM | POA: Diagnosis not present

## 2015-12-07 DIAGNOSIS — T8489XA Other specified complication of internal orthopedic prosthetic devices, implants and grafts, initial encounter: Secondary | ICD-10-CM | POA: Diagnosis not present

## 2015-12-07 DIAGNOSIS — F329 Major depressive disorder, single episode, unspecified: Secondary | ICD-10-CM | POA: Diagnosis present

## 2015-12-07 DIAGNOSIS — D649 Anemia, unspecified: Secondary | ICD-10-CM | POA: Diagnosis not present

## 2015-12-07 DIAGNOSIS — Z96641 Presence of right artificial hip joint: Secondary | ICD-10-CM | POA: Diagnosis not present

## 2015-12-07 DIAGNOSIS — Z9181 History of falling: Secondary | ICD-10-CM | POA: Diagnosis not present

## 2015-12-07 DIAGNOSIS — Z96642 Presence of left artificial hip joint: Secondary | ICD-10-CM | POA: Diagnosis not present

## 2015-12-07 HISTORY — DX: Unspecified fracture of right femur, initial encounter for closed fracture: S72.91XA

## 2015-12-07 HISTORY — DX: Nausea: R11.0

## 2015-12-07 HISTORY — DX: Muscle weakness (generalized): M62.81

## 2015-12-07 HISTORY — DX: Difficulty in walking, not elsewhere classified: R26.2

## 2015-12-07 HISTORY — DX: History of falling: Z91.81

## 2015-12-07 HISTORY — DX: Anemia, unspecified: D64.9

## 2015-12-07 HISTORY — DX: Cognitive communication deficit: R41.841

## 2015-12-07 HISTORY — DX: Hypothyroidism, unspecified: E03.9

## 2015-12-07 HISTORY — DX: Chronic kidney disease, unspecified: N18.9

## 2015-12-07 HISTORY — DX: Unspecified dementia, unspecified severity, without behavioral disturbance, psychotic disturbance, mood disturbance, and anxiety: F03.90

## 2015-12-07 HISTORY — DX: Other lack of coordination: R27.8

## 2015-12-07 HISTORY — DX: Presence of left artificial hip joint: Z96.642

## 2015-12-07 HISTORY — DX: Dysphagia, unspecified: R13.10

## 2015-12-07 HISTORY — PX: HIP ARTHROPLASTY: SHX981

## 2015-12-07 LAB — TYPE AND SCREEN
ABO/RH(D): O POS
Antibody Screen: NEGATIVE

## 2015-12-07 LAB — SURGICAL PCR SCREEN
MRSA, PCR: NEGATIVE
Staphylococcus aureus: NEGATIVE

## 2015-12-07 LAB — ABO/RH: ABO/RH(D): O POS

## 2015-12-07 SURGERY — HEMIARTHROPLASTY, HIP, DIRECT ANTERIOR APPROACH, FOR FRACTURE
Anesthesia: General | Site: Hip | Laterality: Right

## 2015-12-07 MED ORDER — METOCLOPRAMIDE HCL 5 MG/ML IJ SOLN: 5.0000 mg | Freq: Three times a day (TID) | INTRAMUSCULAR | Status: DC | PRN

## 2015-12-07 MED ORDER — METHOCARBAMOL 1000 MG/10ML IJ SOLN
500.0000 mg | Freq: Four times a day (QID) | INTRAVENOUS | Status: DC | PRN
  Administered 2015-12-07: 500 mg via INTRAVENOUS
  Filled 2015-12-07 (×2): qty 5

## 2015-12-07 MED ORDER — SUGAMMADEX SODIUM 200 MG/2ML IV SOLN
INTRAVENOUS | Status: DC | PRN
  Administered 2015-12-07: 136 mg via INTRAVENOUS

## 2015-12-07 MED ORDER — CEFAZOLIN SODIUM-DEXTROSE 2-3 GM-% IV SOLR
2.0000 g | Freq: Four times a day (QID) | INTRAVENOUS | Status: AC
  Administered 2015-12-07 – 2015-12-08 (×2): 2 g via INTRAVENOUS
  Filled 2015-12-07 (×2): qty 50

## 2015-12-07 MED ORDER — TRANEXAMIC ACID 1000 MG/10ML IV SOLN
1000.0000 mg | Freq: Once | INTRAVENOUS | Status: AC
  Administered 2015-12-07: 1000 mg via INTRAVENOUS
  Filled 2015-12-07: qty 10

## 2015-12-07 MED ORDER — ATENOLOL 50 MG PO TABS: 50.0000 mg | ORAL_TABLET | Freq: Every day | ORAL | Status: DC

## 2015-12-07 MED ORDER — POLYETHYLENE GLYCOL 3350 17 G PO PACK: 17.0000 g | PACK | Freq: Two times a day (BID) | ORAL | Status: DC

## 2015-12-07 MED ORDER — ONDANSETRON HCL 4 MG PO TABS: 4.0000 mg | ORAL_TABLET | Freq: Four times a day (QID) | ORAL | Status: DC | PRN

## 2015-12-07 MED ORDER — DIPHENHYDRAMINE HCL 25 MG PO CAPS: 25.0000 mg | ORAL_CAPSULE | Freq: Four times a day (QID) | ORAL | Status: DC | PRN

## 2015-12-07 MED ORDER — CEFAZOLIN SODIUM-DEXTROSE 2-3 GM-% IV SOLR
2.0000 g | INTRAVENOUS | Status: AC
  Administered 2015-12-07: 2 g via INTRAVENOUS

## 2015-12-07 MED ORDER — ONDANSETRON HCL 4 MG/2ML IJ SOLN
INTRAMUSCULAR | Status: AC
  Filled 2015-12-07: qty 2

## 2015-12-07 MED ORDER — DOCUSATE SODIUM 100 MG PO CAPS
100.0000 mg | ORAL_CAPSULE | Freq: Two times a day (BID) | ORAL | Status: DC
  Administered 2015-12-08 – 2015-12-09 (×2): 100 mg via ORAL

## 2015-12-07 MED ORDER — LIDOCAINE HCL (CARDIAC) 20 MG/ML IV SOLN
INTRAVENOUS | Status: AC
  Filled 2015-12-07: qty 5

## 2015-12-07 MED ORDER — FENTANYL CITRATE (PF) 100 MCG/2ML IJ SOLN
INTRAMUSCULAR | Status: AC
  Filled 2015-12-07: qty 2

## 2015-12-07 MED ORDER — LACTATED RINGERS IV SOLN
INTRAVENOUS | Status: DC
  Administered 2015-12-07: 1000 mL via INTRAVENOUS
  Administered 2015-12-07 (×2): via INTRAVENOUS

## 2015-12-07 MED ORDER — LORAZEPAM 1 MG PO TABS: 1.0000 mg | ORAL_TABLET | Freq: Four times a day (QID) | ORAL | Status: DC | PRN

## 2015-12-07 MED ORDER — ONDANSETRON HCL 4 MG/2ML IJ SOLN: 4.0000 mg | Freq: Four times a day (QID) | INTRAMUSCULAR | Status: DC | PRN

## 2015-12-07 MED ORDER — FENTANYL CITRATE (PF) 100 MCG/2ML IJ SOLN: 25.0000 ug | INTRAMUSCULAR | Status: DC | PRN

## 2015-12-07 MED ORDER — FENTANYL CITRATE (PF) 100 MCG/2ML IJ SOLN
50.0000 ug | INTRAMUSCULAR | Status: DC | PRN
  Administered 2015-12-07: 50 ug via INTRAVENOUS

## 2015-12-07 MED ORDER — PROPOFOL 10 MG/ML IV BOLUS
INTRAVENOUS | Status: AC
  Filled 2015-12-07: qty 20

## 2015-12-07 MED ORDER — METOCLOPRAMIDE HCL 10 MG PO TABS: 5.0000 mg | ORAL_TABLET | Freq: Three times a day (TID) | ORAL | Status: DC | PRN

## 2015-12-07 MED ORDER — CHLORHEXIDINE GLUCONATE 4 % EX LIQD
60.0000 mL | Freq: Once | CUTANEOUS | Status: DC
Start: 2015-12-07 — End: 2015-12-07

## 2015-12-07 MED ORDER — HYDROCODONE-ACETAMINOPHEN 7.5-325 MG PO TABS
1.0000 | ORAL_TABLET | ORAL | Status: DC
  Administered 2015-12-08 – 2015-12-09 (×5): 1 via ORAL
  Filled 2015-12-07 (×2): qty 2
  Filled 2015-12-07 (×2): qty 1
  Filled 2015-12-07: qty 2

## 2015-12-07 MED ORDER — SODIUM CHLORIDE 0.9 % IV SOLN
100.0000 mL/h | INTRAVENOUS | Status: DC
  Administered 2015-12-07 – 2015-12-08 (×3): 100 mL/h via INTRAVENOUS
  Filled 2015-12-07 (×5): qty 1000

## 2015-12-07 MED ORDER — ONDANSETRON HCL 4 MG/2ML IJ SOLN
INTRAMUSCULAR | Status: DC | PRN
  Administered 2015-12-07: 4 mg via INTRAVENOUS

## 2015-12-07 MED ORDER — BISACODYL 10 MG RE SUPP: 10.0000 mg | Freq: Every day | RECTAL | Status: DC | PRN

## 2015-12-07 MED ORDER — DEXAMETHASONE SODIUM PHOSPHATE 10 MG/ML IJ SOLN
10.0000 mg | Freq: Once | INTRAMUSCULAR | Status: AC
  Administered 2015-12-08: 10 mg via INTRAVENOUS
  Filled 2015-12-07: qty 1

## 2015-12-07 MED ORDER — DEXAMETHASONE SODIUM PHOSPHATE 10 MG/ML IJ SOLN
INTRAMUSCULAR | Status: AC
  Filled 2015-12-07: qty 1

## 2015-12-07 MED ORDER — ROCURONIUM BROMIDE 100 MG/10ML IV SOLN
INTRAVENOUS | Status: DC | PRN
  Administered 2015-12-07: 40 mg via INTRAVENOUS

## 2015-12-07 MED ORDER — ALUM & MAG HYDROXIDE-SIMETH 200-200-20 MG/5ML PO SUSP: 30.0000 mL | ORAL | Status: DC | PRN

## 2015-12-07 MED ORDER — 0.9 % SODIUM CHLORIDE (POUR BTL) OPTIME
TOPICAL | Status: DC | PRN
  Administered 2015-12-07: 1000 mL

## 2015-12-07 MED ORDER — SUGAMMADEX SODIUM 200 MG/2ML IV SOLN
INTRAVENOUS | Status: AC
  Filled 2015-12-07: qty 2

## 2015-12-07 MED ORDER — MENTHOL 3 MG MT LOZG: 1.0000 | LOZENGE | OROMUCOSAL | Status: DC | PRN

## 2015-12-07 MED ORDER — CELECOXIB 200 MG PO CAPS
200.0000 mg | ORAL_CAPSULE | Freq: Two times a day (BID) | ORAL | Status: DC
  Administered 2015-12-08 – 2015-12-09 (×2): 200 mg via ORAL
  Filled 2015-12-07 (×4): qty 1

## 2015-12-07 MED ORDER — LOSARTAN POTASSIUM 25 MG PO TABS
25.0000 mg | ORAL_TABLET | Freq: Every day | ORAL | Status: DC
  Administered 2015-12-07 – 2015-12-09 (×3): 25 mg via ORAL
  Filled 2015-12-07 (×3): qty 1

## 2015-12-07 MED ORDER — FERROUS SULFATE 325 (65 FE) MG PO TABS
325.0000 mg | ORAL_TABLET | Freq: Three times a day (TID) | ORAL | Status: DC
  Administered 2015-12-08 – 2015-12-09 (×2): 325 mg via ORAL
  Filled 2015-12-07 (×7): qty 1

## 2015-12-07 MED ORDER — CEFAZOLIN SODIUM-DEXTROSE 2-3 GM-% IV SOLR
INTRAVENOUS | Status: AC
  Filled 2015-12-07: qty 50

## 2015-12-07 MED ORDER — ESCITALOPRAM OXALATE 10 MG PO TABS
10.0000 mg | ORAL_TABLET | Freq: Every day | ORAL | Status: DC
  Administered 2015-12-08 – 2015-12-09 (×2): 10 mg via ORAL
  Filled 2015-12-07 (×2): qty 1

## 2015-12-07 MED ORDER — LIDOCAINE HCL (CARDIAC) 20 MG/ML IV SOLN
INTRAVENOUS | Status: DC | PRN
  Administered 2015-12-07: 100 mg via INTRAVENOUS

## 2015-12-07 MED ORDER — METHOCARBAMOL 500 MG PO TABS: 500.0000 mg | ORAL_TABLET | Freq: Four times a day (QID) | ORAL | Status: DC | PRN

## 2015-12-07 MED ORDER — PHENOL 1.4 % MT LIQD
1.0000 | OROMUCOSAL | Status: DC | PRN
  Filled 2015-12-07: qty 177

## 2015-12-07 MED ORDER — PROPOFOL 10 MG/ML IV BOLUS
INTRAVENOUS | Status: DC | PRN
  Administered 2015-12-07: 125 mg via INTRAVENOUS

## 2015-12-07 MED ORDER — FENTANYL CITRATE (PF) 100 MCG/2ML IJ SOLN
INTRAMUSCULAR | Status: DC | PRN
  Administered 2015-12-07: 50 ug via INTRAVENOUS
  Administered 2015-12-07: 100 ug via INTRAVENOUS

## 2015-12-07 MED ORDER — HYDROMORPHONE HCL 1 MG/ML IJ SOLN: 0.5000 mg | INTRAMUSCULAR | Status: DC | PRN

## 2015-12-07 MED ORDER — DEXAMETHASONE SODIUM PHOSPHATE 10 MG/ML IJ SOLN
10.0000 mg | Freq: Once | INTRAMUSCULAR | Status: AC
  Administered 2015-12-07: 10 mg via INTRAVENOUS

## 2015-12-07 MED ORDER — LEVOTHYROXINE SODIUM 100 MCG PO TABS
100.0000 ug | ORAL_TABLET | Freq: Every day | ORAL | Status: DC
  Administered 2015-12-08 – 2015-12-09 (×2): 100 ug via ORAL
  Filled 2015-12-07 (×3): qty 1

## 2015-12-07 MED ORDER — ASPIRIN EC 325 MG PO TBEC
325.0000 mg | DELAYED_RELEASE_TABLET | Freq: Two times a day (BID) | ORAL | Status: DC
  Administered 2015-12-08 – 2015-12-09 (×3): 325 mg via ORAL
  Filled 2015-12-07 (×5): qty 1

## 2015-12-07 MED ORDER — MAGNESIUM CITRATE PO SOLN: 1.0000 | Freq: Once | ORAL | Status: DC | PRN

## 2015-12-07 SURGICAL SUPPLY — 62 items
BAG DECANTER FOR FLEXI CONT (MISCELLANEOUS) ×3 IMPLANT
BAG ZIPLOCK 12X15 (MISCELLANEOUS) ×3 IMPLANT
BLADE SAW SGTL 18X1.27X75 (BLADE) ×2 IMPLANT
BLADE SAW SGTL 18X1.27X75MM (BLADE) ×1
CAPT HIP HEMI 2 ×3 IMPLANT
CLOSURE WOUND 1/2 X4 (GAUZE/BANDAGES/DRESSINGS) ×2
DRAPE INCISE IOBAN 85X60 (DRAPES) ×3 IMPLANT
DRAPE ORTHO SPLIT 77X108 STRL (DRAPES) ×4
DRAPE POUCH INSTRU U-SHP 10X18 (DRAPES) ×3 IMPLANT
DRAPE SURG 17X11 SM STRL (DRAPES) ×3 IMPLANT
DRAPE SURG ORHT 6 SPLT 77X108 (DRAPES) ×2 IMPLANT
DRAPE U-SHAPE 47X51 STRL (DRAPES) ×3 IMPLANT
DRSG AQUACEL AG ADV 3.5X10 (GAUZE/BANDAGES/DRESSINGS) ×3 IMPLANT
DRSG AQUACEL AG ADV 3.5X14 (GAUZE/BANDAGES/DRESSINGS) ×3 IMPLANT
DRSG TEGADERM 4X4.75 (GAUZE/BANDAGES/DRESSINGS) ×3 IMPLANT
DURAPREP 26ML APPLICATOR (WOUND CARE) ×3 IMPLANT
ELECT BLADE TIP CTD 4 INCH (ELECTRODE) ×3 IMPLANT
ELECT REM PT RETURN 9FT ADLT (ELECTROSURGICAL) ×3
ELECTRODE REM PT RTRN 9FT ADLT (ELECTROSURGICAL) ×1 IMPLANT
EVACUATOR 1/8 PVC DRAIN (DRAIN) ×3 IMPLANT
FACESHIELD WRAPAROUND (MASK) ×12 IMPLANT
GAUZE SPONGE 2X2 8PLY STRL LF (GAUZE/BANDAGES/DRESSINGS) ×1 IMPLANT
GLOVE BIOGEL M 7.0 STRL (GLOVE) IMPLANT
GLOVE BIOGEL PI IND STRL 7.5 (GLOVE) ×1 IMPLANT
GLOVE BIOGEL PI IND STRL 8.5 (GLOVE) ×1 IMPLANT
GLOVE BIOGEL PI INDICATOR 7.5 (GLOVE) ×2
GLOVE BIOGEL PI INDICATOR 8.5 (GLOVE) ×2
GLOVE ECLIPSE 8.0 STRL XLNG CF (GLOVE) ×3 IMPLANT
GLOVE ORTHO TXT STRL SZ7.5 (GLOVE) ×6 IMPLANT
GLOVE SURG ORTHO 8.0 STRL STRW (GLOVE) ×3 IMPLANT
GOWN STRL REUS W/TWL LRG LVL3 (GOWN DISPOSABLE) ×3 IMPLANT
GOWN STRL REUS W/TWL XL LVL3 (GOWN DISPOSABLE) ×6 IMPLANT
HANDPIECE INTERPULSE COAX TIP (DISPOSABLE)
IMMOBILIZER KNEE 20 (SOFTGOODS)
IMMOBILIZER KNEE 20 THIGH 36 (SOFTGOODS) IMPLANT
KIT BASIN OR (CUSTOM PROCEDURE TRAY) ×3 IMPLANT
LIQUID BAND (GAUZE/BANDAGES/DRESSINGS) ×3 IMPLANT
MANIFOLD NEPTUNE II (INSTRUMENTS) ×3 IMPLANT
MARKER SKIN DUAL TIP RULER LAB (MISCELLANEOUS) ×3 IMPLANT
NDL SAFETY ECLIPSE 18X1.5 (NEEDLE) ×1 IMPLANT
NEEDLE HYPO 18GX1.5 SHARP (NEEDLE) ×2
NS IRRIG 1000ML POUR BTL (IV SOLUTION) ×3 IMPLANT
PACK TOTAL JOINT (CUSTOM PROCEDURE TRAY) ×3 IMPLANT
PADDING CAST COTTON 6X4 STRL (CAST SUPPLIES) ×3 IMPLANT
POSITIONER SURGICAL ARM (MISCELLANEOUS) ×3 IMPLANT
SET HNDPC FAN SPRY TIP SCT (DISPOSABLE) IMPLANT
SPONGE GAUZE 2X2 STER 10/PKG (GAUZE/BANDAGES/DRESSINGS) ×2
STRIP CLOSURE SKIN 1/2X4 (GAUZE/BANDAGES/DRESSINGS) ×4 IMPLANT
SUCTION FRAZIER HANDLE 10FR (MISCELLANEOUS) ×2
SUCTION TUBE FRAZIER 10FR DISP (MISCELLANEOUS) ×1 IMPLANT
SUT ETHIBOND NAB CT1 #1 30IN (SUTURE) ×3 IMPLANT
SUT MNCRL AB 4-0 PS2 18 (SUTURE) ×3 IMPLANT
SUT VIC AB 1 CT1 36 (SUTURE) ×9 IMPLANT
SUT VIC AB 2-0 CT1 27 (SUTURE) ×4
SUT VIC AB 2-0 CT1 TAPERPNT 27 (SUTURE) ×2 IMPLANT
SUT VLOC 180 0 24IN GS25 (SUTURE) ×6 IMPLANT
SYR 50ML LL SCALE MARK (SYRINGE) ×3 IMPLANT
TOWEL OR 17X26 10 PK STRL BLUE (TOWEL DISPOSABLE) ×6 IMPLANT
TRAY FOLEY W/METER SILVER 14FR (SET/KITS/TRAYS/PACK) ×3 IMPLANT
TRAY FOLEY W/METER SILVER 16FR (SET/KITS/TRAYS/PACK) ×3 IMPLANT
WATER STERILE IRR 1500ML POUR (IV SOLUTION) ×3 IMPLANT
YANKAUER SUCT BULB TIP 10FT TU (MISCELLANEOUS) ×3 IMPLANT

## 2015-12-07 NOTE — Op Note (Signed)
NAMEKERRI, KOVACIK            ACCOUNT NO.:  [id number]  MEDICAL RECORD NO.:  [id number]  LOCATION:  1620                         FACILITY:  [facility]  PHYSICIAN:  Madlyn Frankel. Charlann Boxer, M.D.  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  PROCEDURE:  Conversion of failed right hip surgery to A right hip hemiarthroplasty.  COMPONENTS USED:  A DePuy size 5 standard TRI-LOCK stem with a 49 unipolar ball with a +0 adapter.  SURGEON:  Madlyn Frankel. Charlann Boxer, MD  ASSISTANT:  Lanney Gins, PA-C.  Note that Mr. Carmon Sails was present for the entirety of the case from preoperative positioning, perioperative management of the operative extremity, general facilitation of the case, and primary wound closure.  ANESTHESIA:  General.  BLOOD LOSS:  About 150 mL.  COMPLICATION:  None.  DRAINS:  None.  INDICATIONS FOR PROCEDURE:  Ms. Wilk is an 82 year old female, patient of mine.  She has had a previous left femoral neck fracture, treated with a hemiarthroplasty.  She had a right femoral neck fracture recently and it was determined to be a relatively stable pattern based on radiographic appearance.  We elected to treat with percutaneous screws.  She had been seen recently in followup in the office, noted to have various collapse of her femoral neck fracture.  Her hardware had not yet cut out of the femoral head.  Based on her medical comorbidities including advanced dementia, I elected at this point to treat this as a hemiarthroplasty versus total hip arthroplasty.  Risks, benefits, and necessity of the procedure  discussed, reviewed with her son, power of attorney.  PROCEDURE IN DETAIL:  The patient was brought to operative theater. Once adequate anesthesia was established, preoperative antibiotics, Ancef 2 g administered.  The patient was positioned into this left lateral decubitus position with right side up.  The right lower extremity was then prepped and draped in sterile fashion.  Time-out was performed identifying the patient, planned procedure, and extremity.  The right lower extremity was then prepped and draped in sterile fashion.  A posterior lateral incision was made for posterior approach to the hip, extended distally enough to allow for screw exposure.  Following initial exposure of soft tissue, the iliotibial band and gluteal fascia were split for posterior approach to the hip.  Distally, the vastus lateralis was split to allow for exposure of this palpable screw heads.  All 3 screws were removed from the this lateral aspect of femur without complication.  At this point, the hip was exposed through a posterior approach.  Once the posterior capsulotomy was made, preserving the posterior leaflets, the nonunion readily identified.  The hip was rotated around the neck osteotomy made through the trochanteric fossa down to the lower portion of the medial calcar.  I then removed bone fragments as well as the femoral head.  The femoral head was measured on the  back table to be 49 mm in diameter.  At this point, attention was directed to femoral preparation with the femur flexed internally rotated.  The retractors were placed.  Proximal femur was opened with a drill.  I then hand reamed once.  I then irrigated the canal to try to prevent fat emboli.  Fibrous nonunion tissue removed as necessary.  At this point, we began broaching with the chili pepper broach, then broached up to a size 5, the size 5 broach had good medial and lateral metaphyseal fit.  I then did a trial reduction  with a standard neck in the 49 unipolar ball with a 0 adapter.  The hip reduced and leg lengths appeared to be equal.  The hip was stable throughout a range of motion without incident, dislocation, or subluxation findings.  Given these findings, I removed the trial components.  We elected to use size 5 standard TRI-LOCK stem, which was impacted with the level of the broach was.  This seemed to have the femoral head centered over the tip of the trochanter.  The final 49 unipolar ball was opened and 0 adapter was then impacted into this construct.  The unipolar ball was then impacted onto clean and dried trunnion.  The hip was reduced.  We irrigated the hip throughout the case again at this point.  At this point, I reapproximated the posterior capsular soft tissue enveloped back to the superior leaflet using #1 Vicryl.  The remainder of the wound was closed with #1 Vicryl and the iliotibial band from the distal extent over the vastus lateralis and then a running 0 V-Loc suture used for the remaining portion of the gluteus maximus fascia.  The remainder of the wound was closed with 2-0 Vicryl and a running 3-0 Monocryl.  The hip was then cleaned, dried, and dressed sterilely using surgical glue and a long Aquacel dressing.  She was then brought to the recovery room in stable condition, tolerating the procedure well. Findings reviewed with her family.  Anticipate that she will be weightbearing as tolerated.  She will be in the hospital for 2-3 days and transfer to nursing facility.  She was a bundled patient to Medicare and is being followed by Chong SicilianJill Lauer in our office.     Madlyn FrankelMatthew D. Charlann Boxerlin, M.D.     MDO/MEDQ  D:  12/07/2015  T:  12/07/2015  Job:  [id number]

## 2015-12-07 NOTE — Progress Notes (Signed)
[facility] medication date and time [facility]

## 2015-12-07 NOTE — Progress Notes (Signed)
6th floor notified pt will be in room in 20 minutes-floor requests delay of a few more minutes.

## 2015-12-07 NOTE — Anesthesia Procedure Notes (Signed)
Procedure Name: Intubation Date/Time: 12/07/2015 4:00 PM Performed by: Junious SilkGILBERT, Sharrod Achille Pre-anesthesia Checklist: Patient identified, Emergency Drugs available, Suction available, Patient being monitored and Timeout performed Patient Re-evaluated:Patient Re-evaluated prior to inductionOxygen Delivery Method: Circle system utilized Preoxygenation: Pre-oxygenation with 100% oxygen Intubation Type: IV induction Ventilation: Mask ventilation without difficulty Laryngoscope Size: Miller and 2 Grade View: Grade I Tube type: Oral Tube size: 7.5 mm Number of attempts: 1 Airway Equipment and Method: Stylet Placement Confirmation: ETT inserted through vocal cords under direct vision,  positive ETCO2,  CO2 detector and breath sounds checked- equal and bilateral Secured at: 22 cm Tube secured with: Tape Dental Injury: Teeth and Oropharynx as per pre-operative assessment  Comments: Pt had swollen right upper lip pre induction

## 2015-12-07 NOTE — Transfer of Care (Signed)
Immediate Anesthesia Transfer of Care Note  Patient: Natasha Beasley  Procedure(s) Performed: Procedure(s): CONVERSION RIGHT FEMORAL NECK FRACTURE TO HEMI ARTHROPLASTY  (Right) VERSES TOTAL HIP ARTHROPLASTY (Right)  Patient Location: PACU  Anesthesia Type:General  Level of Consciousness: sedated  Airway & Oxygen Therapy: Patient Spontanous Breathing and Patient connected to face mask oxygen  Post-op Assessment: Report given to RN and Post -op Vital signs reviewed and stable  Post vital signs: Reviewed and stable  Last Vitals:  Filed Vitals:   12/07/15 1253  BP: 151/64  Temp: 36.4 C  Resp: 16    Complications: No apparent anesthesia complications

## 2015-12-07 NOTE — Brief Op Note (Signed)
12/07/2015  4:03 PM  PATIENT:  [place]  82 y.o. female  PRE-OPERATIVE DIAGNOSIS:  Status post percutaneous screw fixation of the right femoral neck fracture with failure related to nonunion   POST-OPERATIVE DIAGNOSIS:  Status post percutaneous screw fixation of the right femoral neck fracture with failure related to nonunion  PROCEDURE:  Procedure(s): CONVERSION RIGHT FEMORAL NECK FRACTURE TO RIGHT HEMI ARTHROPLASTY   SURGEON:  Surgeon(s) and Role:    * Durene RomansMatthew Rylin Seavey, MD - Primary  PHYSICIAN ASSISTANT: Lanney GinsMatthew Babish, PA-C  ANESTHESIA:   general  EBL:   150cc  BLOOD ADMINISTERED:none  DRAINS: none   LOCAL MEDICATIONS USED:  NONE  SPECIMEN:  No Specimen  DISPOSITION OF SPECIMEN:  N/A  COUNTS:  YES  TOURNIQUET:  * No tourniquets in log *  DICTATION: .Other Dictation: Dictation Number [id number]  PLAN OF CARE: Admit to inpatient   PATIENT DISPOSITION:  PACU - hemodynamically stable.   Delay start of Pharmacological VTE agent (>24hrs) due to surgical blood loss or risk of bleeding: no

## 2015-12-07 NOTE — Interval H&P Note (Signed)
History and Physical Interval Note:  12/07/2015 2:56 PM  [place]  has presented today for surgery, with the diagnosis of STATUS POST FRACTURE FEMORAL NECK WITH FAILURE SCREW FIXATION RIGHT FEMUR   The various methods of treatment have been discussed with the patient and family. After consideration of risks, benefits and other options for treatment, the patient has consented to  Procedure(s): CONVERSION RIGHT FEMORAL NECK FRACTURE TO HEMI ARTHROPLASTY  (Right) VERSES TOTAL HIP ARTHROPLASTY (Right) as a surgical intervention .  The patient's history has been reviewed, patient examined, no change in status, stable for surgery.  I have reviewed the patient's chart and labs.  Questions were answered to the patient's satisfaction.     Shelda PalLIN,Jibran Crookshanks D

## 2015-12-07 NOTE — Anesthesia Postprocedure Evaluation (Signed)
Anesthesia Post Note  Patient: Natasha Beasley  Procedure(s) Performed: Procedure(s) (LRB): CONVERSION RIGHT FEMORAL NECK FRACTURE TO HEMI ARTHROPLASTY  (Right) VERSES TOTAL HIP ARTHROPLASTY (Right)  Patient location during evaluation: PACU Anesthesia Type: General Level of consciousness: awake and alert Pain management: pain level controlled Vital Signs Assessment: post-procedure vital signs reviewed and stable Respiratory status: spontaneous breathing, nonlabored ventilation, respiratory function stable and patient connected to nasal cannula oxygen Cardiovascular status: blood pressure returned to baseline and stable Postop Assessment: no signs of nausea or vomiting Anesthetic complications: no    Last Vitals:  Filed Vitals:   12/07/15 1800 12/07/15 1815  BP: 166/79 130/50  Pulse: 75 78  Temp:  36.4 C  Resp: 19 18    Last Pain:  Filed Vitals:   12/07/15 1819  PainSc: 0-No pain                 Lorean Ekstrand L

## 2015-12-07 NOTE — Anesthesia Preprocedure Evaluation (Addendum)
Anesthesia Evaluation  Patient identified by MRN, date of birth, ID band Patient awake    Reviewed: Allergy & Precautions, H&P , NPO status , Patient's Chart, lab work & pertinent test results, reviewed documented beta blocker date and time   Airway Mallampati: II  TM Distance: >3 FB Neck ROM: full    Dental  (+) Dental Advisory Given, Missing A few missing teeth:   Pulmonary neg pulmonary ROS,    Pulmonary exam normal breath sounds clear to auscultation       Cardiovascular hypertension, Pt. on home beta blockers and Pt. on medications Normal cardiovascular exam Rhythm:regular Rate:Normal  RBBB   Neuro/Psych Anxiety Depression Cognitive communication deficit. Dementia. Difficulty walking.  Muscle weakness. negative neurological ROS  negative psych ROS   GI/Hepatic negative GI ROS, Neg liver ROS, dysphagia   Endo/Other  negative endocrine ROSHypothyroidism   Renal/GU negative Renal ROS  negative genitourinary   Musculoskeletal   Abdominal   Peds  Hematology negative hematology ROS (+)   Anesthesia Other Findings   Reproductive/Obstetrics negative OB ROS                            Anesthesia Physical Anesthesia Plan  ASA: III  Anesthesia Plan: General   Post-op Pain Management:    Induction: Intravenous  Airway Management Planned: Oral ETT  Additional Equipment:   Intra-op Plan:   Post-operative Plan: Extubation in OR  Informed Consent: I have reviewed the patients History and Physical, chart, labs and discussed the procedure including the risks, benefits and alternatives for the proposed anesthesia with the patient or authorized representative who has indicated his/her understanding and acceptance.   Dental Advisory Given  Plan Discussed with: CRNA and Surgeon  Anesthesia Plan Comments:        Anesthesia Quick Evaluation

## 2015-12-08 ENCOUNTER — Encounter (HOSPITAL_COMMUNITY): Payer: Self-pay | Admitting: Orthopedic Surgery

## 2015-12-08 LAB — CBC
HEMATOCRIT: 28.6 % — AB (ref 36.0–46.0)
HEMOGLOBIN: 9.1 g/dL — AB (ref 12.0–15.0)
MCH: 29.4 pg (ref 26.0–34.0)
MCHC: 31.8 g/dL (ref 30.0–36.0)
MCV: 92.3 fL (ref 78.0–100.0)
Platelets: 252 10*3/uL (ref 150–400)
RBC: 3.1 MIL/uL — AB (ref 3.87–5.11)
RDW: 14.2 % (ref 11.5–15.5)
WBC: 7.5 10*3/uL (ref 4.0–10.5)

## 2015-12-08 LAB — BASIC METABOLIC PANEL
ANION GAP: 10 (ref 5–15)
BUN: 18 mg/dL (ref 6–20)
CHLORIDE: 104 mmol/L (ref 101–111)
CO2: 21 mmol/L — AB (ref 22–32)
CREATININE: 0.93 mg/dL (ref 0.44–1.00)
Calcium: 8.4 mg/dL — ABNORMAL LOW (ref 8.9–10.3)
GFR calc non Af Amer: 53 mL/min — ABNORMAL LOW (ref >60)
Glucose, Bld: 120 mg/dL — ABNORMAL HIGH (ref 65–99)
Potassium: 5.1 mmol/L (ref 3.5–5.1)
SODIUM: 135 mmol/L (ref 135–145)

## 2015-12-08 NOTE — Evaluation (Signed)
Physical Therapy Evaluation Patient Details Name: Natasha Beasley MRN: [id number] DOB: 08/19/1925 Today's Date: 12/08/2015   History of Present Illness  Pt is an 82 year old female s/p conversion of R femoral neck fracture to R hip hemiarthroplasty with PMHx of R cannulated hip pinning, L THA, dementia  Clinical Impression  Pt admitted with above diagnosis. Pt currently with functional limitations due to the deficits listed below (see PT Problem List).  Pt will benefit from skilled PT to increase their independence and safety with mobility to allow discharge to the venue listed below.   Pt admitted from SNF and plans to return to SNF for rehab per son.  Pt with dementia and pleasant however requires multimodal cues for technique and maintaining precautions.       Follow Up Recommendations SNF;Supervision/Assistance - 24 hour    Equipment Recommendations  None recommended by PT    Recommendations for Other Services       Precautions / Restrictions Precautions Precautions: Fall;Posterior Hip Precaution Comments: multimodal cues due to dementia for hip precautions, handout left in room Restrictions Weight Bearing Restrictions: No Other Position/Activity Restrictions: WBAT      Mobility  Bed Mobility Overal bed mobility: Needs Assistance;+2 for physical assistance Bed Mobility: Supine to Sit     Supine to sit: +2 for physical assistance;Max assist     General bed mobility comments: assist for lower body and trunk upright, pt hesitant to assist due to hip pain  Transfers Overall transfer level: Needs assistance Equipment used: Rolling walker (2 wheeled) Transfers: Sit to/from Stand Sit to Stand: Mod assist;+2 safety/equipment         General transfer comment: multimodal cues for maintaining precautions and safe technique, pt fatigues quickly and requesting to sit midway through transfer  Ambulation/Gait                Stairs            Wheelchair  Mobility    Modified Rankin (Stroke Patients Only)       Balance Overall balance assessment: History of Falls                                           Pertinent Vitals/Pain Pain Assessment: Faces Faces Pain Scale: Hurts little more Pain Location: R hip Pain Descriptors / Indicators: Tender;Discomfort;Guarding;Grimacing Pain Intervention(s): Limited activity within patient's tolerance;Monitored during session;Premedicated before session;Repositioned    Home Living Family/patient expects to be discharged to:: Skilled nursing facility                      Prior Function           Comments: pt independent with no assistive device per notes 11/10/15 when pt admitted for cannulated hip pinning, however has been at SNF since      Hand Dominance        Extremity/Trunk Assessment                         Communication      Cognition Arousal/Alertness: Awake/alert Behavior During Therapy: WFL for tasks assessed/performed Overall Cognitive Status: History of cognitive impairments - at baseline       Memory: Decreased recall of precautions              General Comments General comments (skin integrity, edema, etc.): R hip dressing  not sticking anteriorly - RN notified and aware    Exercises        Assessment/Plan    PT Assessment Patient needs continued PT services  PT Diagnosis Difficulty walking;Acute pain   PT Problem List Decreased strength;Decreased mobility;Decreased balance;Decreased safety awareness;Decreased knowledge of precautions;Decreased knowledge of use of DME;Pain  PT Treatment Interventions DME instruction;Gait training;Functional mobility training;Therapeutic activities;Patient/family education;Therapeutic exercise   PT Goals (Current goals can be found in the Care Plan section) Acute Rehab PT Goals PT Goal Formulation: With patient Time For Goal Achievement: 12/15/15 Potential to Achieve Goals: Good     Frequency Min 3X/week   Barriers to discharge        Co-evaluation PT/OT/SLP Co-Evaluation/Treatment: Yes Reason for Co-Treatment: Necessary to address cognition/behavior during functional activity;For patient/therapist safety PT goals addressed during session: Mobility/safety with mobility OT goals addressed during session: ADL's and self-care       End of Session Equipment Utilized During Treatment: Gait belt Activity Tolerance: Patient limited by pain;Patient limited by fatigue Patient left: in chair;with call bell/phone within reach;with chair alarm set Nurse Communication: Mobility status;Precautions         Time: 1610-9604 PT Time Calculation (min) (ACUTE ONLY): 14 min   Charges:   PT Evaluation $PT Eval Low Complexity: 1 Procedure     PT G Codes:        Natasha Beasley,Natasha Beasley 12/08/2015, 1:15 PM Natasha Beasley, PT, DPT 12/08/2015 Pager: [phone]

## 2015-12-08 NOTE — Clinical Social Work Note (Signed)
Clinical Social Work Assessment  Patient Details  Name: Natasha Beasley MRN: [id number] Date of Birth: [birth year]  Date of referral:  12/08/15               Reason for consult:  Facility Placement, Discharge Planning                Permission sought to share information with:  Facility Art therapist granted to share information::  Yes, Verbal Permission Granted  Name::        Agency::     Relationship::     Contact Information:     Housing/Transportation Living arrangements for the past 2 months:  [place] of Information:  Adult Children Patient Interpreter Needed:  None Criminal Activity/Legal Involvement Pertinent to Current Situation/Hospitalization:  No - Comment as needed Significant Relationships:  Adult Children Lives with:  Facility Resident Do you feel safe going back to the place where you live?  Yes Need for family participation in patient care:  Yes (Comment)  Care giving concerns: Son feels pt should of had surgery weeks ago.   Social Worker assessment / plan:  Pt hospitalized on 12/07/15 with a femoral neck fx. Surgery as been completed. CSW met with son at bedside. Pt was sleeping during visit. Pt had been admitted to [facility] last month for rehab after a fall which required surgery. Son would like pt to return to [facility] at d/c. CSW has contacted SNF and d/c plan as been confirmed. CSW will continue to follow to assist with d/c planning to SNF.  Employment status:  Retired Forensic scientist:  Medicare PT Recommendations:  [place] / Referral to community resources:     Patient/Family's Response to care:  Son agrees continued rehab is needed.  Patient/Family's Understanding of and Emotional Response to Diagnosis, Current Treatment, and Prognosis:  Son is aware of pt's medical status. " I want my mother to return to [facility] because it's so close to my home and I  can visit often." Son expressed frustration trying to arrange this surgery for pt. " It was painful to watch her just lying there in the bed knowing she needed surgery but there not being an opening for her."   Emotional Assessment Appearance:    Attitude/Demeanor/Rapport:  Unable to Assess Affect (typically observed):  Unable to Assess Orientation:  Oriented to Self Alcohol / Substance use:  Not Applicable Psych involvement (Current and /or in the community):  No (Comment)  Discharge Needs  Concerns to be addressed:  Discharge Planning Concerns Readmission within the last 30 days:  No Current discharge risk:  None Barriers to Discharge:  No Barriers Identified   Natasha Beasley  [phone] 12/08/2015, 3:57 PM

## 2015-12-08 NOTE — Care Management Note (Signed)
Case Management Note  Patient Details  Name: Natasha Beasley MRN: [id number] Date of Birth: 02/27/1925  Subjective/Objective:                  CONVERSION RIGHT FEMORAL NECK FRACTURE TO HEMI ARTHROPLASTY (Right) VERSES TOTAL HIP ARTHROPLASTY (Right) Action/Plan: Discharge planning Expected Discharge Date:                  Expected Discharge Plan:  Skilled Nursing Facility  In-House Referral:     Discharge planning Services  CM Consult  Post Acute Care Choice:    Choice offered to:     DME Arranged:    DME Agency:     HH Arranged:    HH Agency:     Status of Service:  Completed, signed off  Medicare Important Message Given:    Date Medicare IM Given:    Medicare IM give by:    Date Additional Medicare IM Given:    Additional Medicare Important Message give by:     If discussed at Long Length of Stay Meetings, dates discussed:    Additional Comments: CM notes pt to go to SNF; CSW arranging.  No other CM needs were communicated. Yves DillJeffries, Doree Kuehne Christine, RN 12/08/2015, 3:13 PM

## 2015-12-08 NOTE — [place] FL2 (Signed)
[place] MEDICAID FL2 LEVEL OF CARE SCREENING TOOL     IDENTIFICATION  Patient Name: Natasha Beasley Birthdate: 1924/12/22 Sex: female Admission Date (Current Location): 12/07/2015  [facility] and [place] Number:  Producer, television/film/video and Address:  [facility],  [address] [place] [address]      Provider Number: [id number]  Attending Physician Name and Address:  Durene Romans, MD  Relative Name and Phone Number:       Current Level of Care: SNF Recommended Level of Care: Skilled Nursing Facility Prior Approval Number:    Date Approved/Denied:   PASRR Number: [id number]  Discharge Plan: SNF    Current Diagnoses: Patient Active Problem List   Diagnosis Date Noted  . S/P right hip hemi 12/07/2015  . Closed right hip fracture (HCC) 11/08/2015  . ARF (acute renal failure) (HCC) 11/08/2015  . Hypertension 11/08/2015  . HLD (hyperlipidemia) 11/08/2015  . Hypothyroidism 11/08/2015  . Hip fracture (HCC) 11/08/2015    Orientation RESPIRATION BLADDER Height & Weight     Self  Normal Continent Weight: 68.04 kg (150 lb) Height:   (172.7 cm) (per pts son)  BEHAVIORAL SYMPTOMS/MOOD NEUROLOGICAL BOWEL NUTRITION STATUS  Other (Comment) (no behaviors)   Continent Diet  AMBULATORY STATUS COMMUNICATION OF NEEDS Skin   Extensive Assist Verbally Surgical wounds                       Personal Care Assistance Level of Assistance  Bathing, Feeding, Dressing Bathing Assistance: Maximum assistance Feeding assistance: Independent Dressing Assistance: Maximum assistance     Functional Limitations Info  Sight, Hearing, Speech Sight Info: Adequate Hearing Info: Impaired Speech Info: Adequate    SPECIAL CARE FACTORS FREQUENCY  PT (By licensed PT), OT (By licensed OT)     PT Frequency: 5 x wk OT Frequency: 5 x wk            Contractures Contractures Info: Not present    Additional Factors Info  Code Status Code Status Info: Full             Current Medications (12/08/2015):  This is the current hospital active medication list Current Facility-Administered Medications  Medication Dose Route Frequency Provider Last Rate Last Dose  . alum & mag hydroxide-simeth (MAALOX/MYLANTA) 200-200-20 MG/5ML suspension 30 mL  30 mL Oral Q4H PRN Lanney Gins, PA-C      . aspirin EC tablet 325 mg  325 mg Oral BID Lanney Gins, PA-C   325 mg at 12/08/15 0933  . [START ON 12/15/2015] atenolol (TENORMIN) tablet 50 mg  50 mg Oral Daily Lanney Gins, PA-C      . bisacodyl (DULCOLAX) suppository 10 mg  10 mg Rectal Daily PRN Lanney Gins, PA-C      . celecoxib (CELEBREX) capsule 200 mg  200 mg Oral Q12H Lanney Gins, PA-C   200 mg at 12/08/15 7846  . dexamethasone (DECADRON) injection 10 mg  10 mg Intravenous Once Lanney Gins, PA-C      . diphenhydrAMINE (BENADRYL) capsule 25 mg  25 mg Oral Q6H PRN Lanney Gins, PA-C      . docusate sodium (COLACE) capsule 100 mg  100 mg Oral BID Lanney Gins, PA-C   100 mg at 12/08/15 0933  . escitalopram (LEXAPRO) tablet 10 mg  10 mg Oral Daily Lanney Gins, PA-C   10 mg at 12/08/15 9629  . ferrous sulfate tablet 325 mg  325 mg Oral TID PC Lanney Gins, PA-C   325  mg at 12/08/15 0933  . HYDROcodone-acetaminophen (NORCO) 7.5-325 MG per tablet 1-2 tablet  1-2 tablet Oral Q4H Lanney GinsMatthew Babish, PA-C   1 tablet at 12/08/15 [phone]  . HYDROmorphone (DILAUDID) injection 0.5-1 mg  0.5-1 mg Intravenous Q2H PRN Lanney GinsMatthew Babish, PA-C      . levothyroxine (SYNTHROID, LEVOTHROID) tablet 100 mcg  100 mcg Oral QAC breakfast Lanney GinsMatthew Babish, PA-C   100 mcg at 12/08/15 [id number]  . LORazepam (ATIVAN) tablet 1 mg  1 mg Oral Q6H PRN Lanney GinsMatthew Babish, PA-C      . losartan (COZAAR) tablet 25 mg  25 mg Oral Daily Lanney GinsMatthew Babish, PA-C   25 mg at 12/08/15 0933  . magnesium citrate solution 1 Bottle  1 Bottle Oral Once PRN Lanney GinsMatthew Babish, PA-C      . menthol-cetylpyridinium (CEPACOL) lozenge 3 mg  1 lozenge Oral PRN Lanney GinsMatthew Babish, PA-C        Or  . phenol (CHLORASEPTIC) mouth spray 1 spray  1 spray Mouth/Throat PRN Lanney GinsMatthew Babish, PA-C      . methocarbamol (ROBAXIN) tablet 500 mg  500 mg Oral Q6H PRN Lanney GinsMatthew Babish, PA-C       Or  . methocarbamol (ROBAXIN) 500 mg in dextrose 5 % 50 mL IVPB  500 mg Intravenous Q6H PRN Lanney GinsMatthew Babish, PA-C   500 mg at 12/07/15 1806  . metoCLOPramide (REGLAN) tablet 5-10 mg  5-10 mg Oral Q8H PRN Lanney GinsMatthew Babish, PA-C       Or  . metoCLOPramide (REGLAN) injection 5-10 mg  5-10 mg Intravenous Q8H PRN Lanney GinsMatthew Babish, PA-C      . ondansetron [facility](ZOFRAN) tablet 4 mg  4 mg Oral Q6H PRN Lanney GinsMatthew Babish, PA-C       Or  . ondansetron [facility](ZOFRAN) injection 4 mg  4 mg Intravenous Q6H PRN Lanney GinsMatthew Babish, PA-C      . polyethylene glycol (MIRALAX / GLYCOLAX) packet 17 g  17 g Oral BID Lanney GinsMatthew Babish, PA-C   17 g at 12/07/15 2200  . sodium chloride 0.9 % 1,000 mL infusion  100 mL/hr Intravenous Continuous Lanney GinsMatthew Babish, PA-C 20 mL/hr at 12/08/15 1000 20 mL/hr at 12/08/15 1000     Discharge Medications: Please see discharge summary for a list of discharge medications.  Relevant Imaging Results:  Relevant Lab Results:   Additional Information SSN [id number]  Kamarah Bilotta, Dickey GaveJamie Lee, LCSW

## 2015-12-08 NOTE — Progress Notes (Signed)
     Subjective: 1 Day Post-Op Procedure(s) (LRB): CONVERSION RIGHT FEMORAL NECK FRACTURE TO HEMI ARTHROPLASTY  (Right)   Patient is resting in bed, rubbing her right hip.  States that she is in a little pain.  She is confused, but does have a history of dementia.   Objective:   VITALS:   Filed Vitals:   12/08/15 0158 12/08/15 0457  BP: 121/57 146/42  Pulse: 77 71  Temp: 98.4 F (36.9 C) 98.8 F (37.1 C)  Resp: 16 16    Dorsiflexion/Plantar flexion intact Incision: dressing C/D/I No cellulitis present Compartment soft  LABS  Recent Labs  12/08/15 0430  HGB 9.1*  HCT 28.6*  WBC 7.5  PLT 252     Recent Labs  12/08/15 0430  NA 135  K 5.1  BUN 18  CREATININE 0.93  GLUCOSE 120*     Assessment/Plan: 1 Day Post-Op Procedure(s) (LRB): CONVERSION RIGHT FEMORAL NECK FRACTURE TO HEMI ARTHROPLASTY  (Right) Foley cath d/c'ed Advance diet Up with therapy D/C IV fluids Discharge to SNF, eventually when ready      Anastasio AuerbachMatthew S. Wenda Vanschaick   PAC  12/08/2015, 8:04 AM

## 2015-12-08 NOTE — Progress Notes (Signed)
Occupational Therapy Evaluation Patient Details Name: Natasha Beasley MRN: [id number] DOB: Apr 05, 1925 Today's Date: 12/08/2015    History of Present Illness Pt is an 82 year old female s/p conversion of R femoral neck fracture to R hip hemiarthroplasty with PMHx of R cannulated hip pinning, L THA, dementia   Clinical Impression   Patient presents to OT with decreased ADL independence and safety, history of dementia and cannot recall posterior hip precautions. All further OT needs can be met at next venue of care [facility] SNF). Acute OT will sign off.    Follow Up Recommendations  SNF    Equipment Recommendations  Other (comment) (tbd at next venue of care)    Recommendations for Other Services       Precautions / Restrictions Precautions Precautions: Fall;Posterior Hip Precaution Comments: multimodal cues due to dementia for hip precautions, handout left in room Restrictions Weight Bearing Restrictions: No Other Position/Activity Restrictions: WBAT      Mobility Bed Mobility Overal bed mobility: Needs Assistance;+2 for physical assistance Bed Mobility: Supine to Sit     Supine to sit: +2 for physical assistance;Max assist     General bed mobility comments: assist for lower body and trunk upright, pt hesitant to assist due to hip pain  Transfers Overall transfer level: Needs assistance Equipment used: Rolling walker (2 wheeled) Transfers: Sit to/from Stand Sit to Stand: Mod assist;+2 safety/equipment         General transfer comment: multimodal cues for maintaining precautions and safe technique, pt fatigues quickly and requesting to sit midway through transfer    Balance                                           ADL Overall ADL's : Needs assistance/impaired Eating/Feeding: Minimal assistance;Bed level   Grooming: Moderate assistance;Sitting;Bed level       Lower Body Bathing: Total assistance       Lower Body Dressing:  Total assistance               Functional mobility during ADLs: Moderate assistance;+2 for physical assistance;+2 for safety/equipment;Rolling walker General ADL Comments: Patient received in bed; needed frequent reorientation. Assisted patient to EOB, then to standing, then pivot to recliner. Required +2 mod/max A for all. Patient left up in recliner with chair alarm in place. Attempted education on posterior hip precautions, but patient unable to retain information. Handout left in room for family.      Vision     Perception     Praxis      Pertinent Vitals/Pain Pain Assessment: Faces Faces Pain Scale: Hurts little more Pain Location: R hip Pain Descriptors / Indicators: Tender;Discomfort;Guarding;Grimacing Pain Intervention(s): Limited activity within patient's tolerance;Monitored during session;Premedicated before session;Repositioned     Hand Dominance     Extremity/Trunk Assessment Upper Extremity Assessment Upper Extremity Assessment: Overall WFL for tasks assessed   Lower Extremity Assessment Lower Extremity Assessment: Defer to PT evaluation       Communication Communication Communication: HOH   Cognition Arousal/Alertness: Awake/alert Behavior During Therapy: WFL for tasks assessed/performed Overall Cognitive Status: History of cognitive impairments - at baseline       Memory: Decreased recall of precautions             General Comments       Exercises       Shoulder Instructions      Home Living  Family/patient expects to be discharged to:: Skilled nursing facility                                        Prior Functioning/Environment          Comments: pt independent with no assistive device per notes 11/10/15 when pt admitted for cannulated hip pinning, however has been at SNF since     OT Diagnosis: Acute pain;Cognitive deficits;Generalized weakness   OT Problem List: Decreased strength;Decreased range of  motion;Decreased activity tolerance;Impaired balance (sitting and/or standing);Decreased safety awareness;Decreased cognition;Decreased knowledge of use of DME or AE;Decreased knowledge of precautions;Pain   OT Treatment/Interventions:      OT Goals(Current goals can be found in the care plan section) Acute Rehab OT Goals Patient Stated Goal: none stated OT Goal Formulation: All assessment and education complete, DC therapy  OT Frequency:     Barriers to D/C:            Co-evaluation PT/OT/SLP Co-Evaluation/Treatment: Yes Reason for Co-Treatment: For patient/therapist safety PT goals addressed during session: Mobility/safety with mobility OT goals addressed during session: ADL's and self-care      End of Session Equipment Utilized During Treatment: Rolling walker;Gait belt Nurse Communication: Mobility status  Activity Tolerance: Patient tolerated treatment well Patient left: in chair;with call bell/phone within reach;with chair alarm set   Time: 1041-1057 OT Time Calculation (min): 16 min Charges:  OT General Charges $OT Visit: 1 Procedure OT Evaluation $OT Eval Moderate Complexity: 1 Procedure G-Codes:    Krystalyn Kubota A 2015-12-12, 1:17 PM

## 2015-12-09 DIAGNOSIS — M25551 Pain in right hip: Secondary | ICD-10-CM | POA: Diagnosis not present

## 2015-12-09 DIAGNOSIS — R1312 Dysphagia, oropharyngeal phase: Secondary | ICD-10-CM | POA: Diagnosis not present

## 2015-12-09 DIAGNOSIS — Z043 Encounter for examination and observation following other accident: Secondary | ICD-10-CM | POA: Diagnosis not present

## 2015-12-09 DIAGNOSIS — Y9389 Activity, other specified: Secondary | ICD-10-CM | POA: Diagnosis not present

## 2015-12-09 DIAGNOSIS — W010XXA Fall on same level from slipping, tripping and stumbling without subsequent striking against object, initial encounter: Secondary | ICD-10-CM | POA: Diagnosis not present

## 2015-12-09 DIAGNOSIS — I10 Essential (primary) hypertension: Secondary | ICD-10-CM | POA: Diagnosis not present

## 2015-12-09 DIAGNOSIS — R278 Other lack of coordination: Secondary | ICD-10-CM | POA: Diagnosis not present

## 2015-12-09 DIAGNOSIS — R110 Nausea: Secondary | ICD-10-CM | POA: Diagnosis not present

## 2015-12-09 DIAGNOSIS — Z96642 Presence of left artificial hip joint: Secondary | ICD-10-CM | POA: Diagnosis not present

## 2015-12-09 DIAGNOSIS — Z96641 Presence of right artificial hip joint: Secondary | ICD-10-CM | POA: Diagnosis not present

## 2015-12-09 DIAGNOSIS — D649 Anemia, unspecified: Secondary | ICD-10-CM | POA: Diagnosis not present

## 2015-12-09 DIAGNOSIS — M6281 Muscle weakness (generalized): Secondary | ICD-10-CM | POA: Diagnosis not present

## 2015-12-09 DIAGNOSIS — S79921A Unspecified injury of right thigh, initial encounter: Secondary | ICD-10-CM | POA: Diagnosis not present

## 2015-12-09 DIAGNOSIS — I129 Hypertensive chronic kidney disease with stage 1 through stage 4 chronic kidney disease, or unspecified chronic kidney disease: Secondary | ICD-10-CM | POA: Diagnosis not present

## 2015-12-09 DIAGNOSIS — S79911A Unspecified injury of right hip, initial encounter: Secondary | ICD-10-CM | POA: Diagnosis not present

## 2015-12-09 DIAGNOSIS — S72041P Displaced fracture of base of neck of right femur, subsequent encounter for closed fracture with malunion: Secondary | ICD-10-CM | POA: Diagnosis not present

## 2015-12-09 DIAGNOSIS — Z8781 Personal history of (healed) traumatic fracture: Secondary | ICD-10-CM | POA: Diagnosis not present

## 2015-12-09 DIAGNOSIS — S72009A Fracture of unspecified part of neck of unspecified femur, initial encounter for closed fracture: Secondary | ICD-10-CM | POA: Diagnosis not present

## 2015-12-09 DIAGNOSIS — Z9181 History of falling: Secondary | ICD-10-CM | POA: Diagnosis not present

## 2015-12-09 DIAGNOSIS — N183 Chronic kidney disease, stage 3 (moderate): Secondary | ICD-10-CM | POA: Diagnosis not present

## 2015-12-09 DIAGNOSIS — F329 Major depressive disorder, single episode, unspecified: Secondary | ICD-10-CM | POA: Diagnosis not present

## 2015-12-09 DIAGNOSIS — S72001D Fracture of unspecified part of neck of right femur, subsequent encounter for closed fracture with routine healing: Secondary | ICD-10-CM | POA: Diagnosis not present

## 2015-12-09 DIAGNOSIS — Z7982 Long term (current) use of aspirin: Secondary | ICD-10-CM | POA: Diagnosis not present

## 2015-12-09 DIAGNOSIS — E785 Hyperlipidemia, unspecified: Secondary | ICD-10-CM | POA: Diagnosis not present

## 2015-12-09 DIAGNOSIS — Z79899 Other long term (current) drug therapy: Secondary | ICD-10-CM | POA: Diagnosis not present

## 2015-12-09 DIAGNOSIS — E039 Hypothyroidism, unspecified: Secondary | ICD-10-CM | POA: Diagnosis not present

## 2015-12-09 DIAGNOSIS — M79651 Pain in right thigh: Secondary | ICD-10-CM | POA: Diagnosis not present

## 2015-12-09 DIAGNOSIS — R262 Difficulty in walking, not elsewhere classified: Secondary | ICD-10-CM | POA: Diagnosis not present

## 2015-12-09 DIAGNOSIS — Y9289 Other specified places as the place of occurrence of the external cause: Secondary | ICD-10-CM | POA: Diagnosis not present

## 2015-12-09 DIAGNOSIS — R41841 Cognitive communication deficit: Secondary | ICD-10-CM | POA: Diagnosis not present

## 2015-12-09 DIAGNOSIS — Y998 Other external cause status: Secondary | ICD-10-CM | POA: Diagnosis not present

## 2015-12-09 DIAGNOSIS — M96661 Fracture of femur following insertion of orthopedic implant, joint prosthesis, or bone plate, right leg: Secondary | ICD-10-CM | POA: Diagnosis not present

## 2015-12-09 DIAGNOSIS — F0281 Dementia in other diseases classified elsewhere with behavioral disturbance: Secondary | ICD-10-CM | POA: Diagnosis not present

## 2015-12-09 DIAGNOSIS — F0390 Unspecified dementia without behavioral disturbance: Secondary | ICD-10-CM | POA: Diagnosis not present

## 2015-12-09 DIAGNOSIS — F419 Anxiety disorder, unspecified: Secondary | ICD-10-CM | POA: Diagnosis not present

## 2015-12-09 DIAGNOSIS — R52 Pain, unspecified: Secondary | ICD-10-CM | POA: Diagnosis not present

## 2015-12-09 LAB — BASIC METABOLIC PANEL
Anion gap: 9 (ref 5–15)
BUN: 20 mg/dL (ref 6–20)
CHLORIDE: 105 mmol/L (ref 101–111)
CO2: 23 mmol/L (ref 22–32)
CREATININE: 0.84 mg/dL (ref 0.44–1.00)
Calcium: 8.6 mg/dL — ABNORMAL LOW (ref 8.9–10.3)
GFR calc Af Amer: >60 mL/min (ref >60)
GFR calc non Af Amer: 60 mL/min — ABNORMAL LOW (ref >60)
GLUCOSE: 123 mg/dL — AB (ref 65–99)
POTASSIUM: 4.2 mmol/L (ref 3.5–5.1)
SODIUM: 137 mmol/L (ref 135–145)

## 2015-12-09 LAB — CBC
HCT: 27.5 % — ABNORMAL LOW (ref 36.0–46.0)
HEMOGLOBIN: 8.6 g/dL — AB (ref 12.0–15.0)
MCH: 28.8 pg (ref 26.0–34.0)
MCHC: 31.3 g/dL (ref 30.0–36.0)
MCV: 92 fL (ref 78.0–100.0)
Platelets: 266 10*3/uL (ref 150–400)
RBC: 2.99 MIL/uL — AB (ref 3.87–5.11)
RDW: 14.3 % (ref 11.5–15.5)
WBC: 7.8 10*3/uL (ref 4.0–10.5)

## 2015-12-09 MED ORDER — DOCUSATE SODIUM 100 MG PO CAPS: 100.0000 mg | ORAL_CAPSULE | Freq: Two times a day (BID) | ORAL | Status: DC

## 2015-12-09 MED ORDER — POLYETHYLENE GLYCOL 3350 17 G PO PACK: 17.0000 g | PACK | Freq: Two times a day (BID) | ORAL | Status: AC

## 2015-12-09 MED ORDER — HYDROCODONE-ACETAMINOPHEN 5-325 MG PO TABS: 1.0000 | ORAL_TABLET | Freq: Four times a day (QID) | ORAL | Status: DC | PRN

## 2015-12-09 MED ORDER — FERROUS SULFATE 325 (65 FE) MG PO TABS: 325.0000 mg | ORAL_TABLET | Freq: Three times a day (TID) | ORAL | Status: DC

## 2015-12-09 MED ORDER — ASPIRIN 325 MG PO TBEC: 325.0000 mg | DELAYED_RELEASE_TABLET | Freq: Two times a day (BID) | ORAL | Status: DC

## 2015-12-09 NOTE — Progress Notes (Signed)
[facility] [place] [facility]Sharrell Ku. D Connor Meacham RN

## 2015-12-09 NOTE — Discharge Summary (Signed)
Physician Discharge Summary  Patient ID: Natasha Beasley MRN: [id number] DOB/AGE: 80/18/1926 80 y.o.  Admit date: 12/07/2015 Discharge date:  12/09/2015  Procedures:  Procedure(s) (LRB): CONVERSION RIGHT FEMORAL NECK FRACTURE TO HEMI ARTHROPLASTY  (Right)  Attending Physician:  Dr. Durene Romans   Admission Diagnoses:   Failed previous right hip surgery  Discharge Diagnoses:  Principal Problem:   S/P right hip hemi  Past Medical History  Diagnosis Date  . Hypertension   . Hypercholesteremia   . Thyroid disease   . Anxiety   . Depression   . Fracture of right femur (HCC)     neck of right femur   . Hx of falling   . Hypothyroidism   . Chronic kidney disease     chronic kidney disease stage III  . Anemia   . Muscle weakness   . Other lack of coordination   . Difficulty in walking   . Dysphagia     oropharyngeal phase  . Cognitive communication deficit   . Presence of left artificial hip joint   . Dementia   . Nausea     HPI:    Pt is a 82 y.o. female complaining of right hip pain. She had a previous surgery to repair a fracture with percutaneous cannulated screw fixation. She had pain since the beginning, but did fall back in February 2017 while leaving church. She states increasing pain since that time. Pain had continually increased since the beginning. X-rays in the clinic show her fracture to be displaced into a varus deformity, significant shortening is already noted of the fracture and the one most superior screws about to cut out of the femoral head. Pt has tried various conservative treatments which have failed to alleviate their symptoms. Various options are discussed with the patient. Risks, benefits and expectations were discussed with the patient. Patient understand the risks, benefits and expectations and wishes to proceed with surgery.   PCP: Junious Silk, MD   Discharged Condition: good  Hospital Course:  Patient underwent the above  stated procedure on 12/07/2015. Patient tolerated the procedure well and brought to the recovery room in good condition and subsequently to the floor.  POD #1 BP: 146/42 ; Pulse: 71 ; Temp: 98.8 F (37.1 C) ; Resp: 16 Patient is resting in bed, rubbing her right hip. States that she is in a little pain. She is confused, but does have a history of dementia.  Dorsiflexion/plantar flexion intact, incision: dressing C/D/I, no cellulitis present and compartment soft.   LABS  Basename    HGB     9.1  HCT     28.6   POD #2  BP: 179/57 ; Pulse: 73 ; Temp: 8.3 F (36.8 C) ; Resp: 18 Patient confused, but very pleasant. Feels that she is doing well and doesn't need surgery. She can't understand that we already did her surgery. Resting comfortably in the chair.  Dorsiflexion/plantar flexion intact, incision: dressing C/D/I, no cellulitis present and compartment soft.   LABS  Basename    HGB     8.6  HCT     27.5    Discharge Exam: General appearance: alert, cooperative and no distress Extremities: Homans sign is negative, no sign of DVT, no edema, redness or tenderness in the calves or thighs and no ulcers, gangrene or trophic changes  Disposition:   Skilled Nursing Facility with follow up in 2 weeks   Follow-up Information    Follow up with [facility] .  Specialty:  Skilled Nursing Facility   Contact information:   [address] [place] [id number] [phone]      Follow up with Shelda PalLIN,Ulyess Muto D, MD. Schedule an appointment as soon as possible for a visit in 2 weeks.   Specialty:  Orthopedic Surgery   Contact information:   [address] [place] [id number] [phone]       Discharge Instructions    Call MD / Call 911    Complete by:  As directed   If you experience chest pain or shortness of breath, CALL 911 and be transported to the hospital emergency room.  If you develope a fever above 101 F, pus (white drainage)  or increased drainage or redness at the wound, or calf pain, call your surgeon's office.     Change dressing    Complete by:  As directed   Maintain surgical dressing until follow up in the clinic. If the edges start to pull up, may reinforce with tape. If the dressing is no longer working, may remove and cover with gauze and tape, but must keep the area dry and clean.  Call with any questions or concerns.     Constipation Prevention    Complete by:  As directed   Drink plenty of fluids.  Prune juice may be helpful.  You may use a stool softener, such as Colace (over the counter) 100 mg twice a day.  Use MiraLax (over the counter) for constipation as needed.     Diet - low sodium heart healthy    Complete by:  As directed      Discharge instructions    Complete by:  As directed   Maintain surgical dressing until follow up in the clinic. If the edges start to pull up, may reinforce with tape. If the dressing is no longer working, may remove and cover with gauze and tape, but must keep the area dry and clean.  [facility]. Call with any questions or concerns.     Increase activity slowly as tolerated    Complete by:  As directed   Weight bearing as tolerated with assist device (walker, cane, etc) as directed, use it as long as suggested by your surgeon or therapist, typically at least 4-6 weeks.     TED hose    Complete by:  As directed   Use stockings (TED hose) for 2 weeks on both leg(s).  You may remove them at night for sleeping.             Medication List    STOP taking these medications        acetaminophen 325 MG tablet  Commonly known as:  TYLENOL      TAKE these medications        aspirin 325 MG EC tablet  Take 1 tablet (325 mg total) by mouth 2 (two) times daily.     atenolol 50 MG tablet  Commonly known as:  TENORMIN  Take 50 mg by mouth daily.     cholecalciferol 1000 units tablet  Commonly known as:  VITAMIN D  Take 1,000  Units by mouth daily.     docusate sodium 100 MG capsule  Commonly known as:  COLACE  Take 1 capsule (100 mg total) by mouth 2 (two) times daily.     escitalopram 10 MG tablet  Commonly known as:  LEXAPRO  Take 10 mg by mouth daily.  ferrous sulfate 325 (65 FE) MG tablet  Take 1 tablet (325 mg total) by mouth 3 (three) times daily after meals.     HYDROcodone-acetaminophen 5-325 MG tablet  Commonly known as:  NORCO/VICODIN  Take 1-2 tablets by mouth every 6 (six) hours as needed for moderate pain.     levothyroxine 100 MCG tablet  Commonly known as:  SYNTHROID, LEVOTHROID  Take 100 mcg by mouth daily. On an empty stomach     LORazepam 1 MG tablet  Commonly known as:  ATIVAN  Take 1 mg by mouth every 6 (six) hours as needed for anxiety.     losartan 25 MG tablet  Commonly known as:  COZAAR  Take 25 mg by mouth daily.     polyethylene glycol packet  Commonly known as:  MIRALAX / GLYCOLAX  Take 17 g by mouth 2 (two) times daily.     senna-docusate 8.6-50 MG tablet  Commonly known as:  Senokot-S  Take 1 tablet by mouth daily as needed for mild constipation.         Signed: Anastasio Auerbach. Shellby Schlink   PA-C  12/09/2015, 8:01 AM

## 2015-12-09 NOTE — Progress Notes (Signed)
     Subjective: 2 Days Post-Op Procedure(s) (LRB): CONVERSION RIGHT FEMORAL NECK FRACTURE TO HEMI ARTHROPLASTY  (Right)   Patient confused, but very pleasant. Feels that she is doing well and doesn't need surgery.  She can't understand that we already did her surgery.  Resting comfortably in the chair.    Objective:   VITALS:   Filed Vitals:   12/08/15 2152 12/09/15 0540  BP: 161/72 179/57  Pulse: 73 73  Temp: 98.4 F (36.9 C) 98.3 F (36.8 C)  Resp: 18 18    Dorsiflexion/Plantar flexion intact Incision: dressing C/D/I No cellulitis present Compartment soft  LABS  Recent Labs  12/08/15 0430 12/09/15 0406  HGB 9.1* 8.6*  HCT 28.6* 27.5*  WBC 7.5 7.8  PLT 252 266     Recent Labs  12/08/15 0430 12/09/15 0406  NA 135 137  K 5.1 4.2  BUN 18 20  CREATININE 0.93 0.84  GLUCOSE 120* 123*     Assessment/Plan: 2 Days Post-Op Procedure(s) (LRB): CONVERSION RIGHT FEMORAL NECK FRACTURE TO HEMI ARTHROPLASTY  (Right) Up with therapy Discharge to SNF  [facility]. Follow up with OLIN,Lariyah Shetterly D in 2 weeks.  Contact information:  [facility] [address] [place] [id number] [phone]         Anastasio AuerbachMatthew S. Mallie Linnemann   PAC  12/09/2015, 7:56 AM

## 2015-12-09 NOTE — Progress Notes (Signed)
Pt is [facility] to [facility]. Pt / son are in agreement with this plan. [facility] transport is required. Pt / son are aware out of pocket costs may be associated with [facility] transport. D/C Summary sent to SNF for review prior to d/c. Scripts included in d/c packet. # for reort provided to nsg.  Cori RazorJamie Ivie Savitt LCSW [phone]

## 2015-12-09 NOTE — Discharge Instructions (Signed)

## 2015-12-19 ENCOUNTER — Emergency Department (HOSPITAL_COMMUNITY): Payer: Medicare Other

## 2015-12-19 ENCOUNTER — Encounter (HOSPITAL_COMMUNITY): Payer: Self-pay

## 2015-12-19 ENCOUNTER — Emergency Department (HOSPITAL_COMMUNITY)
Admission: EM | Admit: 2015-12-19 | Discharge: 2015-12-19 | Disposition: A | Discharge status: Home / Self Care | Payer: Medicare Other | Attending: Emergency Medicine | Admitting: Emergency Medicine

## 2015-12-19 DIAGNOSIS — F329 Major depressive disorder, single episode, unspecified: Secondary | ICD-10-CM | POA: Insufficient documentation

## 2015-12-19 DIAGNOSIS — F419 Anxiety disorder, unspecified: Secondary | ICD-10-CM | POA: Insufficient documentation

## 2015-12-19 DIAGNOSIS — Z7982 Long term (current) use of aspirin: Secondary | ICD-10-CM | POA: Insufficient documentation

## 2015-12-19 DIAGNOSIS — Z8781 Personal history of (healed) traumatic fracture: Secondary | ICD-10-CM | POA: Insufficient documentation

## 2015-12-19 DIAGNOSIS — D649 Anemia, unspecified: Secondary | ICD-10-CM | POA: Insufficient documentation

## 2015-12-19 DIAGNOSIS — Z79899 Other long term (current) drug therapy: Secondary | ICD-10-CM | POA: Insufficient documentation

## 2015-12-19 DIAGNOSIS — Z043 Encounter for examination and observation following other accident: Secondary | ICD-10-CM | POA: Insufficient documentation

## 2015-12-19 DIAGNOSIS — R52 Pain, unspecified: Secondary | ICD-10-CM

## 2015-12-19 DIAGNOSIS — E039 Hypothyroidism, unspecified: Secondary | ICD-10-CM | POA: Insufficient documentation

## 2015-12-19 DIAGNOSIS — M79651 Pain in right thigh: Secondary | ICD-10-CM | POA: Diagnosis not present

## 2015-12-19 DIAGNOSIS — S79921A Unspecified injury of right thigh, initial encounter: Secondary | ICD-10-CM | POA: Diagnosis not present

## 2015-12-19 DIAGNOSIS — S79911A Unspecified injury of right hip, initial encounter: Secondary | ICD-10-CM | POA: Diagnosis not present

## 2015-12-19 DIAGNOSIS — W19XXXA Unspecified fall, initial encounter: Secondary | ICD-10-CM

## 2015-12-19 DIAGNOSIS — M25551 Pain in right hip: Secondary | ICD-10-CM | POA: Diagnosis not present

## 2015-12-19 DIAGNOSIS — W010XXA Fall on same level from slipping, tripping and stumbling without subsequent striking against object, initial encounter: Secondary | ICD-10-CM | POA: Insufficient documentation

## 2015-12-19 DIAGNOSIS — F0390 Unspecified dementia without behavioral disturbance: Secondary | ICD-10-CM | POA: Insufficient documentation

## 2015-12-19 DIAGNOSIS — Y9389 Activity, other specified: Secondary | ICD-10-CM | POA: Insufficient documentation

## 2015-12-19 DIAGNOSIS — Y998 Other external cause status: Secondary | ICD-10-CM | POA: Insufficient documentation

## 2015-12-19 DIAGNOSIS — I129 Hypertensive chronic kidney disease with stage 1 through stage 4 chronic kidney disease, or unspecified chronic kidney disease: Secondary | ICD-10-CM | POA: Insufficient documentation

## 2015-12-19 DIAGNOSIS — N183 Chronic kidney disease, stage 3 (moderate): Secondary | ICD-10-CM | POA: Insufficient documentation

## 2015-12-19 DIAGNOSIS — Y9289 Other specified places as the place of occurrence of the external cause: Secondary | ICD-10-CM | POA: Insufficient documentation

## 2015-12-19 NOTE — ED Notes (Signed)
PT DISCHARGED. INSTRUCTIONS GIVEN TO [facility]. AAOX1. PT IN NO APPARENT DISTRESS OR PAIN. THE OPPORTUNITY TO ASK QUESTIONS WAS PROVIDED.

## 2015-12-19 NOTE — ED Notes (Signed)
Bed: [id number] Expected date: 12/19/15 Expected time: 12:48 PM Means of arrival: Ambulance Comments: Fall, hip pain

## 2015-12-19 NOTE — Discharge Instructions (Signed)
There does not appear to be an emergent cause or symptoms related to your fall from earlier today. Her x-rays were negative for any acute broken bones or dislocations. You may follow-up with your doctor in next 2 or 3 days for reevaluation. Return to ED for any new or worsening symptoms.

## 2015-12-19 NOTE — ED Provider Notes (Signed)
CSN: [id number]     Arrival date & time 12/19/15  1251 History   First MD Initiated Contact with Patient 12/19/15 1253     Chief Complaint  Patient presents with  . Fall    PER FACILITY, PT FELL UNASSISTED DURING PT ON HER RIGHT HIP   Level V Caveat dementia  (Consider location/radiation/quality/duration/timing/severity/associated sxs/prior Treatment) HPI Natasha Beasley is a 82 y.o. female with history of dementia, recent ORIF for hip fracture in February, comes in for evaluation after a fall. Patient arrives via EMS for evaluation After a witnessed fall. Patient was at physical therapy, trying to stand by herself and fell forward. Per facility, patient did not hit her head, no LOC. They think she may have landed on her surgically repaired right hip, but were able to help her back up. She denies any discomfort now, no decreased range of motion, numbness or weakness. She denies any abdominal pain, chest pain or shortness of breath or other medical problems. She was given 1 Norco by facility prior to arrival. She takes 325 mg aspirin, but no other anticoagulation  Past Medical History  Diagnosis Date  . Hypertension   . Hypercholesteremia   . Thyroid disease   . Anxiety   . Depression   . Fracture of right femur (HCC)     neck of right femur   . Hx of falling   . Hypothyroidism   . Chronic kidney disease     chronic kidney disease stage III  . Anemia   . Muscle weakness   . Other lack of coordination   . Difficulty in walking   . Dysphagia     oropharyngeal phase  . Cognitive communication deficit   . Presence of left artificial hip joint   . Dementia   . Nausea    Past Surgical History  Procedure Laterality Date  . Hip surgery    . Hip pinning,cannulated Right 11/09/2015    Procedure: CANNULATED HIP PINNING;  Surgeon: Durene Romans, MD;  Location: [facility] OR;  Service: Orthopedics;  Laterality: Right;  . Hip arthroplasty Right 12/07/2015    Procedure: CONVERSION RIGHT FEMORAL NECK  FRACTURE TO HEMI ARTHROPLASTY ;  Surgeon: Durene Romans, MD;  Location: [facility] ORS;  Service: Orthopedics;  Laterality: Right;   Family History  Problem Relation Age of Onset  . Hypertension Son    Social History  Substance Use Topics  . Smoking status: Never Smoker   . Smokeless tobacco: None  . Alcohol Use: No   OB History    No data available     Review of Systems A 10 point review of systems was completed and was negative except for pertinent positives and negatives as mentioned in the history of present illness     Allergies  Review of patient's allergies indicates no known allergies.  Home Medications   Prior to Admission medications   Medication Sig Start Date End Date Taking? Authorizing Provider  aspirin EC 325 MG EC tablet Take 1 tablet (325 mg total) by mouth 2 (two) times daily. 12/09/15  Yes Matthew Babish, PA-C  atenolol (TENORMIN) 50 MG tablet Take 50 mg by mouth daily.     Yes Historical Provider, MD  cholecalciferol (VITAMIN D) 1000 units tablet Take 1,000 Units by mouth daily.   Yes Historical Provider, MD  docusate sodium (COLACE) 100 MG capsule Take 1 capsule (100 mg total) by mouth 2 (two) times daily. 12/09/15  Yes Matthew Babish, PA-C  escitalopram (LEXAPRO) 10 MG tablet Take  10 mg by mouth daily.     Yes Historical Provider, MD  ferrous sulfate 325 (65 FE) MG tablet Take 1 tablet (325 mg total) by mouth 3 (three) times daily after meals. 12/09/15  Yes Lanney Gins, PA-C  HYDROcodone-acetaminophen (NORCO/VICODIN) 5-325 MG tablet Take 1-2 tablets by mouth every 6 (six) hours as needed for moderate pain. Patient taking differently: Take 1 tablet by mouth every 6 (six) hours as needed for moderate pain.  12/09/15  Yes Lanney Gins, PA-C  levothyroxine (SYNTHROID, LEVOTHROID) 100 MCG tablet Take 100 mcg by mouth daily before breakfast. On an empty stomach   Yes Historical Provider, MD  LORazepam (ATIVAN) 1 MG tablet Take 1 mg by mouth every 6 (six) hours as needed  for anxiety.   Yes Historical Provider, MD  losartan (COZAAR) 25 MG tablet Take 25 mg by mouth daily.   Yes Historical Provider, MD  polyethylene glycol (MIRALAX / GLYCOLAX) packet Take 17 g by mouth 2 (two) times daily. 12/09/15  Yes Matthew Babish, PA-C  senna-docusate (SENOKOT-S) 8.6-50 MG tablet Take 1 tablet by mouth daily as needed for mild constipation.   Yes Historical Provider, MD   BP 171/65 mmHg  Pulse 69  Temp(Src) 98.5 F (36.9 C) (Oral)  Resp 18  Ht  (1.651 m)  Wt 63.504 kg  BMI 23.30 kg/m2  SpO2 100% Physical Exam  Constitutional: She is oriented to person, place, and time. She appears well-developed and well-nourished.  Elderly female with dementia, but very pleasant  HENT:  Head: Normocephalic and atraumatic.  Mouth/Throat: Oropharynx is clear and moist.  Eyes: Conjunctivae are normal. Pupils are equal, round, and reactive to light. Right eye exhibits no discharge. Left eye exhibits no discharge. No scleral icterus.  Neck: Neck supple.  Cardiovascular: Normal rate, regular rhythm and normal heart sounds.   Pulmonary/Chest: Effort normal and breath sounds normal. No respiratory distress. She has no wheezes. She has no rales.  Abdominal: Soft. There is no tenderness.  Musculoskeletal: She exhibits no tenderness.  Full range of motion of bilateral lower extremities without crepitus or deformity. No tenderness. Distal pulses intact with brisk cap refill. No other abnormalities noted.  Neurological: She is alert and oriented to person, place, and time.  Cranial Nerves II-XII grossly intact  Skin: Skin is warm and dry. No rash noted.  Psychiatric: She has a normal mood and affect.  Nursing note and vitals reviewed.   ED Course  Procedures (including critical care time) Labs Review Labs Reviewed - No data to display  Imaging Review Dg Hip Unilat With Pelvis 2-3 Views Right  12/19/2015  CLINICAL DATA:  82 year old female with fall and right hip pain. Initial  encounter. EXAM: DG HIP (WITH OR WITHOUT PELVIS) 2-3V RIGHT COMPARISON:  12/07/2015 FINDINGS: Bilateral hip hemiarthroplasties identified. There is no evidence of acute fracture, subluxation or dislocation. No hardware complicating features noted. IMPRESSION: No evidence of acute abnormality. Bilateral hip hemi arthroplasties without definite complicating features. Electronically Signed   By: Harmon Pier M.D.   On: 12/19/2015 14:18   Dg Femur, Min 2 Views Right  12/19/2015  CLINICAL DATA:  Right femur pain after fall. EXAM: RIGHT FEMUR 2 VIEWS COMPARISON:  None. FINDINGS: Status post right hip arthroplasty. No fracture or dislocation is noted. Prosthesis appears be well situated. No soft tissue abnormality seen. IMPRESSION: No acute abnormality seen in the right femur. Electronically Signed   By: Lupita Raider, M.D.   On: 12/19/2015 14:17   I have personally  reviewed and evaluated these images and lab results as part of my medical decision-making.   EKG Interpretation None     Filed Vitals:   12/19/15 1258 12/19/15 1302 12/19/15 1306  BP: 171/65    Pulse: 69    Temp: 98.5 F (36.9 C)    TempSrc: Oral    Resp: 18    Height:   5\' 5"  (1.651 m)  Weight:   63.504 kg  SpO2: 99% 100%     MDM  Natasha LodgeJuanita Beasley is a 82 y.o. female with a history of dementia from skilled nursing facility presents today for evaluation after a witnessed mechanical fall. She does have a history of hip surgery in February and there was some concern she may have fallen on her right hip. Physical exam is unremarkable, no focal tenderness, maintains full active range of motion and is neurovascularly intact. Benign abdominal exam. However, we will obtain plain films of the femur and pelvis. X-rays are negative. Plan to discharge back to nursing facility. Patient overall appears well and at baseline and appropriate for outpatient follow-up next week. Prior to patient discharge, I discussed and reviewed this case with  Dr.Belfi  Final diagnoses:  Fall, initial encounter        Joycie PeekBenjamin Kaylan Yates, PA-C 12/19/15 1453  Rolan BuccoMelanie Belfi, MD 12/19/15 [phone]

## 2015-12-19 NOTE — ED Notes (Addendum)
PT RECEIVED VIA EMS FOR A FALL ONTO HER RIGHT SIDE DURING PT UNASSISTED. NO LOC. [facility] FOR A PREVIOUS FALL AND RIGHT HIP REPLACEMENT. PT HAS PAIN TO THE RIGHT UPPER LEG. PT DENIES FALLING DUE TO DEMENTIA.

## 2015-12-25 DIAGNOSIS — M96661 Fracture of femur following insertion of orthopedic implant, joint prosthesis, or bone plate, right leg: Secondary | ICD-10-CM | POA: Diagnosis not present

## 2015-12-25 DIAGNOSIS — S72041P Displaced fracture of base of neck of right femur, subsequent encounter for closed fracture with malunion: Secondary | ICD-10-CM | POA: Diagnosis not present

## 2016-01-01 DIAGNOSIS — F0281 Dementia in other diseases classified elsewhere with behavioral disturbance: Secondary | ICD-10-CM | POA: Diagnosis not present

## 2016-01-01 DIAGNOSIS — S72001D Fracture of unspecified part of neck of right femur, subsequent encounter for closed fracture with routine healing: Secondary | ICD-10-CM | POA: Diagnosis not present

## 2016-01-01 DIAGNOSIS — I10 Essential (primary) hypertension: Secondary | ICD-10-CM | POA: Diagnosis not present

## 2016-01-01 DIAGNOSIS — N183 Chronic kidney disease, stage 3 (moderate): Secondary | ICD-10-CM | POA: Diagnosis not present

## 2016-01-07 DIAGNOSIS — N183 Chronic kidney disease, stage 3 (moderate): Secondary | ICD-10-CM | POA: Diagnosis not present

## 2016-01-07 DIAGNOSIS — F0390 Unspecified dementia without behavioral disturbance: Secondary | ICD-10-CM | POA: Diagnosis not present

## 2016-01-07 DIAGNOSIS — S72001D Fracture of unspecified part of neck of right femur, subsequent encounter for closed fracture with routine healing: Secondary | ICD-10-CM | POA: Diagnosis not present

## 2016-01-07 DIAGNOSIS — F329 Major depressive disorder, single episode, unspecified: Secondary | ICD-10-CM | POA: Diagnosis not present

## 2016-01-07 DIAGNOSIS — I129 Hypertensive chronic kidney disease with stage 1 through stage 4 chronic kidney disease, or unspecified chronic kidney disease: Secondary | ICD-10-CM | POA: Diagnosis not present

## 2016-01-07 DIAGNOSIS — M6281 Muscle weakness (generalized): Secondary | ICD-10-CM | POA: Diagnosis not present

## 2016-01-11 DIAGNOSIS — F0390 Unspecified dementia without behavioral disturbance: Secondary | ICD-10-CM | POA: Diagnosis not present

## 2016-01-11 DIAGNOSIS — I129 Hypertensive chronic kidney disease with stage 1 through stage 4 chronic kidney disease, or unspecified chronic kidney disease: Secondary | ICD-10-CM | POA: Diagnosis not present

## 2016-01-11 DIAGNOSIS — F329 Major depressive disorder, single episode, unspecified: Secondary | ICD-10-CM | POA: Diagnosis not present

## 2016-01-11 DIAGNOSIS — S72001D Fracture of unspecified part of neck of right femur, subsequent encounter for closed fracture with routine healing: Secondary | ICD-10-CM | POA: Diagnosis not present

## 2016-01-11 DIAGNOSIS — N183 Chronic kidney disease, stage 3 (moderate): Secondary | ICD-10-CM | POA: Diagnosis not present

## 2016-01-11 DIAGNOSIS — M6281 Muscle weakness (generalized): Secondary | ICD-10-CM | POA: Diagnosis not present

## 2016-01-12 DIAGNOSIS — R7301 Impaired fasting glucose: Secondary | ICD-10-CM | POA: Diagnosis not present

## 2016-01-12 DIAGNOSIS — E782 Mixed hyperlipidemia: Secondary | ICD-10-CM | POA: Diagnosis not present

## 2016-01-12 DIAGNOSIS — M810 Age-related osteoporosis without current pathological fracture: Secondary | ICD-10-CM | POA: Diagnosis not present

## 2016-01-12 DIAGNOSIS — R350 Frequency of micturition: Secondary | ICD-10-CM | POA: Diagnosis not present

## 2016-01-12 DIAGNOSIS — E559 Vitamin D deficiency, unspecified: Secondary | ICD-10-CM | POA: Diagnosis not present

## 2016-01-12 DIAGNOSIS — E038 Other specified hypothyroidism: Secondary | ICD-10-CM | POA: Diagnosis not present

## 2016-01-12 DIAGNOSIS — I10 Essential (primary) hypertension: Secondary | ICD-10-CM | POA: Diagnosis not present

## 2016-01-13 DIAGNOSIS — F0390 Unspecified dementia without behavioral disturbance: Secondary | ICD-10-CM | POA: Diagnosis not present

## 2016-01-13 DIAGNOSIS — N183 Chronic kidney disease, stage 3 (moderate): Secondary | ICD-10-CM | POA: Diagnosis not present

## 2016-01-13 DIAGNOSIS — F329 Major depressive disorder, single episode, unspecified: Secondary | ICD-10-CM | POA: Diagnosis not present

## 2016-01-13 DIAGNOSIS — S72001D Fracture of unspecified part of neck of right femur, subsequent encounter for closed fracture with routine healing: Secondary | ICD-10-CM | POA: Diagnosis not present

## 2016-01-13 DIAGNOSIS — M6281 Muscle weakness (generalized): Secondary | ICD-10-CM | POA: Diagnosis not present

## 2016-01-13 DIAGNOSIS — I129 Hypertensive chronic kidney disease with stage 1 through stage 4 chronic kidney disease, or unspecified chronic kidney disease: Secondary | ICD-10-CM | POA: Diagnosis not present

## 2016-01-18 DIAGNOSIS — F0390 Unspecified dementia without behavioral disturbance: Secondary | ICD-10-CM | POA: Diagnosis not present

## 2016-01-18 DIAGNOSIS — M6281 Muscle weakness (generalized): Secondary | ICD-10-CM | POA: Diagnosis not present

## 2016-01-18 DIAGNOSIS — N183 Chronic kidney disease, stage 3 (moderate): Secondary | ICD-10-CM | POA: Diagnosis not present

## 2016-01-18 DIAGNOSIS — S72001D Fracture of unspecified part of neck of right femur, subsequent encounter for closed fracture with routine healing: Secondary | ICD-10-CM | POA: Diagnosis not present

## 2016-01-18 DIAGNOSIS — F329 Major depressive disorder, single episode, unspecified: Secondary | ICD-10-CM | POA: Diagnosis not present

## 2016-01-18 DIAGNOSIS — I129 Hypertensive chronic kidney disease with stage 1 through stage 4 chronic kidney disease, or unspecified chronic kidney disease: Secondary | ICD-10-CM | POA: Diagnosis not present

## 2016-01-21 DIAGNOSIS — F329 Major depressive disorder, single episode, unspecified: Secondary | ICD-10-CM | POA: Diagnosis not present

## 2016-01-21 DIAGNOSIS — M6281 Muscle weakness (generalized): Secondary | ICD-10-CM | POA: Diagnosis not present

## 2016-01-21 DIAGNOSIS — I129 Hypertensive chronic kidney disease with stage 1 through stage 4 chronic kidney disease, or unspecified chronic kidney disease: Secondary | ICD-10-CM | POA: Diagnosis not present

## 2016-01-21 DIAGNOSIS — F0390 Unspecified dementia without behavioral disturbance: Secondary | ICD-10-CM | POA: Diagnosis not present

## 2016-01-21 DIAGNOSIS — S72001D Fracture of unspecified part of neck of right femur, subsequent encounter for closed fracture with routine healing: Secondary | ICD-10-CM | POA: Diagnosis not present

## 2016-01-21 DIAGNOSIS — N183 Chronic kidney disease, stage 3 (moderate): Secondary | ICD-10-CM | POA: Diagnosis not present

## 2016-01-27 DIAGNOSIS — Z4789 Encounter for other orthopedic aftercare: Secondary | ICD-10-CM | POA: Diagnosis not present

## 2016-01-27 DIAGNOSIS — S72041P Displaced fracture of base of neck of right femur, subsequent encounter for closed fracture with malunion: Secondary | ICD-10-CM | POA: Diagnosis not present

## 2016-02-23 DIAGNOSIS — H11431 Conjunctival hyperemia, right eye: Secondary | ICD-10-CM | POA: Diagnosis not present

## 2016-02-26 DIAGNOSIS — S72041P Displaced fracture of base of neck of right femur, subsequent encounter for closed fracture with malunion: Secondary | ICD-10-CM | POA: Diagnosis not present

## 2016-02-26 DIAGNOSIS — Z4789 Encounter for other orthopedic aftercare: Secondary | ICD-10-CM | POA: Diagnosis not present

## 2016-04-11 DIAGNOSIS — E038 Other specified hypothyroidism: Secondary | ICD-10-CM | POA: Diagnosis not present

## 2016-04-11 DIAGNOSIS — R7301 Impaired fasting glucose: Secondary | ICD-10-CM | POA: Diagnosis not present

## 2016-04-11 DIAGNOSIS — I10 Essential (primary) hypertension: Secondary | ICD-10-CM | POA: Diagnosis not present

## 2016-04-11 DIAGNOSIS — E782 Mixed hyperlipidemia: Secondary | ICD-10-CM | POA: Diagnosis not present

## 2016-04-11 DIAGNOSIS — E559 Vitamin D deficiency, unspecified: Secondary | ICD-10-CM | POA: Diagnosis not present

## 2016-04-13 DIAGNOSIS — E559 Vitamin D deficiency, unspecified: Secondary | ICD-10-CM | POA: Diagnosis not present

## 2016-04-13 DIAGNOSIS — R350 Frequency of micturition: Secondary | ICD-10-CM | POA: Diagnosis not present

## 2016-04-13 DIAGNOSIS — E782 Mixed hyperlipidemia: Secondary | ICD-10-CM | POA: Diagnosis not present

## 2016-04-13 DIAGNOSIS — I10 Essential (primary) hypertension: Secondary | ICD-10-CM | POA: Diagnosis not present

## 2016-04-13 DIAGNOSIS — R7301 Impaired fasting glucose: Secondary | ICD-10-CM | POA: Diagnosis not present

## 2016-04-13 DIAGNOSIS — M810 Age-related osteoporosis without current pathological fracture: Secondary | ICD-10-CM | POA: Diagnosis not present

## 2016-04-13 DIAGNOSIS — E038 Other specified hypothyroidism: Secondary | ICD-10-CM | POA: Diagnosis not present

## 2016-08-30 DIAGNOSIS — Z23 Encounter for immunization: Secondary | ICD-10-CM | POA: Diagnosis not present

## 2018-10-01 ENCOUNTER — Emergency Department (HOSPITAL_COMMUNITY): Payer: Medicare Other

## 2018-10-01 ENCOUNTER — Encounter (HOSPITAL_COMMUNITY): Payer: Self-pay

## 2018-10-01 ENCOUNTER — Other Ambulatory Visit: Payer: Self-pay

## 2018-10-01 ENCOUNTER — Inpatient Hospital Stay (HOSPITAL_COMMUNITY)
Admission: EM | Admit: 2018-10-01 | Discharge: 2018-10-08 | DRG: 071 | Disposition: A | Discharge status: Skilled Nursing Facility | Payer: Medicare Other | Attending: Internal Medicine | Admitting: Internal Medicine

## 2018-10-01 DIAGNOSIS — Z7982 Long term (current) use of aspirin: Secondary | ICD-10-CM

## 2018-10-01 DIAGNOSIS — R636 Underweight: Secondary | ICD-10-CM | POA: Diagnosis present

## 2018-10-01 DIAGNOSIS — H9190 Unspecified hearing loss, unspecified ear: Secondary | ICD-10-CM | POA: Diagnosis present

## 2018-10-01 DIAGNOSIS — F03918 Unspecified dementia, unspecified severity, with other behavioral disturbance: Secondary | ICD-10-CM

## 2018-10-01 DIAGNOSIS — G309 Alzheimer's disease, unspecified: Secondary | ICD-10-CM | POA: Diagnosis not present

## 2018-10-01 DIAGNOSIS — Z87440 Personal history of urinary (tract) infections: Secondary | ICD-10-CM | POA: Diagnosis not present

## 2018-10-01 DIAGNOSIS — F05 Delirium due to known physiological condition: Secondary | ICD-10-CM | POA: Diagnosis present

## 2018-10-01 DIAGNOSIS — F0391 Unspecified dementia with behavioral disturbance: Secondary | ICD-10-CM | POA: Diagnosis present

## 2018-10-01 DIAGNOSIS — N183 Chronic kidney disease, stage 3 (moderate): Secondary | ICD-10-CM | POA: Diagnosis present

## 2018-10-01 DIAGNOSIS — R627 Adult failure to thrive: Secondary | ICD-10-CM | POA: Diagnosis present

## 2018-10-01 DIAGNOSIS — E039 Hypothyroidism, unspecified: Secondary | ICD-10-CM | POA: Diagnosis not present

## 2018-10-01 DIAGNOSIS — G9341 Metabolic encephalopathy: Principal | ICD-10-CM | POA: Diagnosis present

## 2018-10-01 DIAGNOSIS — R4182 Altered mental status, unspecified: Secondary | ICD-10-CM

## 2018-10-01 DIAGNOSIS — Z79899 Other long term (current) drug therapy: Secondary | ICD-10-CM

## 2018-10-01 DIAGNOSIS — E7800 Pure hypercholesterolemia, unspecified: Secondary | ICD-10-CM | POA: Diagnosis present

## 2018-10-01 DIAGNOSIS — Z681 Body mass index (BMI) 19 or less, adult: Secondary | ICD-10-CM

## 2018-10-01 DIAGNOSIS — D72829 Elevated white blood cell count, unspecified: Secondary | ICD-10-CM | POA: Diagnosis not present

## 2018-10-01 DIAGNOSIS — D649 Anemia, unspecified: Secondary | ICD-10-CM | POA: Diagnosis present

## 2018-10-01 DIAGNOSIS — F329 Major depressive disorder, single episode, unspecified: Secondary | ICD-10-CM | POA: Diagnosis present

## 2018-10-01 DIAGNOSIS — I959 Hypotension, unspecified: Secondary | ICD-10-CM | POA: Diagnosis not present

## 2018-10-01 DIAGNOSIS — Z66 Do not resuscitate: Secondary | ICD-10-CM | POA: Diagnosis present

## 2018-10-01 DIAGNOSIS — N179 Acute kidney failure, unspecified: Secondary | ICD-10-CM | POA: Diagnosis present

## 2018-10-01 DIAGNOSIS — I129 Hypertensive chronic kidney disease with stage 1 through stage 4 chronic kidney disease, or unspecified chronic kidney disease: Secondary | ICD-10-CM | POA: Diagnosis present

## 2018-10-01 DIAGNOSIS — E870 Hyperosmolality and hypernatremia: Secondary | ICD-10-CM | POA: Diagnosis not present

## 2018-10-01 DIAGNOSIS — E860 Dehydration: Secondary | ICD-10-CM | POA: Diagnosis present

## 2018-10-01 DIAGNOSIS — R410 Disorientation, unspecified: Secondary | ICD-10-CM | POA: Diagnosis present

## 2018-10-01 DIAGNOSIS — E878 Other disorders of electrolyte and fluid balance, not elsewhere classified: Secondary | ICD-10-CM | POA: Diagnosis present

## 2018-10-01 DIAGNOSIS — I10 Essential (primary) hypertension: Secondary | ICD-10-CM | POA: Diagnosis not present

## 2018-10-01 DIAGNOSIS — F0281 Dementia in other diseases classified elsewhere with behavioral disturbance: Secondary | ICD-10-CM

## 2018-10-01 DIAGNOSIS — N390 Urinary tract infection, site not specified: Secondary | ICD-10-CM | POA: Diagnosis present

## 2018-10-01 DIAGNOSIS — Z96641 Presence of right artificial hip joint: Secondary | ICD-10-CM | POA: Diagnosis present

## 2018-10-01 DIAGNOSIS — E785 Hyperlipidemia, unspecified: Secondary | ICD-10-CM | POA: Diagnosis present

## 2018-10-01 DIAGNOSIS — Z885 Allergy status to narcotic agent status: Secondary | ICD-10-CM | POA: Diagnosis not present

## 2018-10-01 DIAGNOSIS — G9340 Encephalopathy, unspecified: Secondary | ICD-10-CM | POA: Diagnosis not present

## 2018-10-01 DIAGNOSIS — E876 Hypokalemia: Secondary | ICD-10-CM | POA: Diagnosis not present

## 2018-10-01 LAB — CBC WITH DIFFERENTIAL/PLATELET
Abs Immature Granulocytes: 0.18 10*3/uL — ABNORMAL HIGH (ref 0.00–0.07)
Basophils Absolute: 0 10*3/uL (ref 0.0–0.1)
Basophils Relative: 0 %
EOS PCT: 0 %
Eosinophils Absolute: 0 10*3/uL (ref 0.0–0.5)
HCT: 37.2 % (ref 36.0–46.0)
Hemoglobin: 11.2 g/dL — ABNORMAL LOW (ref 12.0–15.0)
Immature Granulocytes: 1 %
LYMPHS ABS: 1.3 10*3/uL (ref 0.7–4.0)
Lymphocytes Relative: 6 %
MCH: 29.3 pg (ref 26.0–34.0)
MCHC: 30.1 g/dL (ref 30.0–36.0)
MCV: 97.4 fL (ref 80.0–100.0)
Monocytes Absolute: 1 10*3/uL (ref 0.1–1.0)
Monocytes Relative: 4 %
NRBC: 0 % (ref 0.0–0.2)
Neutro Abs: 20.9 10*3/uL — ABNORMAL HIGH (ref 1.7–7.7)
Neutrophils Relative %: 89 %
Platelets: 374 10*3/uL (ref 150–400)
RBC: 3.82 MIL/uL — ABNORMAL LOW (ref 3.87–5.11)
RDW: 13.6 % (ref 11.5–15.5)
WBC: 23.5 10*3/uL — ABNORMAL HIGH (ref 4.0–10.5)

## 2018-10-01 LAB — URINALYSIS, COMPLETE (UACMP) WITH MICROSCOPIC
Bilirubin Urine: NEGATIVE
GLUCOSE, UA: NEGATIVE mg/dL
Ketones, ur: NEGATIVE mg/dL
Nitrite: NEGATIVE
Protein, ur: >=300 mg/dL — AB
Specific Gravity, Urine: 1.02 (ref 1.005–1.030)
pH: 8 (ref 5.0–8.0)

## 2018-10-01 LAB — COMPREHENSIVE METABOLIC PANEL
ALT: 16 U/L (ref 0–44)
AST: 21 U/L (ref 15–41)
Albumin: 2.6 g/dL — ABNORMAL LOW (ref 3.5–5.0)
Alkaline Phosphatase: 83 U/L (ref 38–126)
Anion gap: 8 (ref 5–15)
BUN: 46 mg/dL — ABNORMAL HIGH (ref 8–23)
CO2: 24 mmol/L (ref 22–32)
Calcium: 12.6 mg/dL — ABNORMAL HIGH (ref 8.9–10.3)
Chloride: 107 mmol/L (ref 98–111)
Creatinine, Ser: 1.32 mg/dL — ABNORMAL HIGH (ref 0.44–1.00)
GFR calc Af Amer: 40 mL/min — ABNORMAL LOW (ref >60)
GFR calc non Af Amer: 35 mL/min — ABNORMAL LOW (ref >60)
Glucose, Bld: 106 mg/dL — ABNORMAL HIGH (ref 70–99)
Potassium: 4.6 mmol/L (ref 3.5–5.1)
Sodium: 139 mmol/L (ref 135–145)
Total Bilirubin: 1.6 mg/dL — ABNORMAL HIGH (ref 0.3–1.2)
Total Protein: 6.9 g/dL (ref 6.5–8.1)

## 2018-10-01 LAB — APTT: aPTT: 32 seconds (ref 24–36)

## 2018-10-01 LAB — PROTIME-INR
INR: 1.24
Prothrombin Time: 15.5 seconds — ABNORMAL HIGH (ref 11.4–15.2)

## 2018-10-01 LAB — CBG MONITORING, ED: Glucose-Capillary: 93 mg/dL (ref 70–99)

## 2018-10-01 LAB — TSH: TSH: 9.005 u[IU]/mL — ABNORMAL HIGH (ref 0.350–4.500)

## 2018-10-01 LAB — AMMONIA: Ammonia: 21 umol/L (ref 9–35)

## 2018-10-01 MED ORDER — ASPIRIN EC 325 MG PO TBEC: 325.0000 mg | DELAYED_RELEASE_TABLET | Freq: Two times a day (BID) | ORAL | Status: DC

## 2018-10-01 MED ORDER — ENOXAPARIN SODIUM 300 MG/3ML IJ SOLN
20.0000 mg | INTRAMUSCULAR | Status: DC
  Administered 2018-10-02 – 2018-10-07 (×6): 20 mg via SUBCUTANEOUS
  Filled 2018-10-01 (×9): qty 0.2

## 2018-10-01 MED ORDER — FERROUS SULFATE 325 (65 FE) MG PO TABS
325.0000 mg | ORAL_TABLET | Freq: Three times a day (TID) | ORAL | Status: DC
  Administered 2018-10-04 – 2018-10-08 (×8): 325 mg via ORAL
  Filled 2018-10-01 (×7): qty 1

## 2018-10-01 MED ORDER — ATENOLOL 25 MG PO TABS: 50.0000 mg | ORAL_TABLET | Freq: Every day | ORAL | Status: DC

## 2018-10-01 MED ORDER — SENNOSIDES-DOCUSATE SODIUM 8.6-50 MG PO TABS: 1.0000 | ORAL_TABLET | Freq: Every day | ORAL | Status: DC | PRN

## 2018-10-01 MED ORDER — VITAMIN D 25 MCG (1000 UNIT) PO TABS
1000.0000 [IU] | ORAL_TABLET | Freq: Every day | ORAL | Status: DC
  Administered 2018-10-04 – 2018-10-08 (×5): 1000 [IU] via ORAL
  Filled 2018-10-01 (×5): qty 1

## 2018-10-01 MED ORDER — STROKE: EARLY STAGES OF RECOVERY BOOK
Freq: Once | Status: AC
  Administered 2018-10-02
  Filled 2018-10-01: qty 1

## 2018-10-01 MED ORDER — ONDANSETRON HCL 4 MG/2ML IJ SOLN: 4.0000 mg | Freq: Four times a day (QID) | INTRAMUSCULAR | Status: DC | PRN

## 2018-10-01 MED ORDER — SODIUM CHLORIDE 0.9 % IV BOLUS
500.0000 mL | Freq: Once | INTRAVENOUS | Status: AC
  Administered 2018-10-01: 500 mL via INTRAVENOUS

## 2018-10-01 MED ORDER — LORAZEPAM 1 MG PO TABS: 1.0000 mg | ORAL_TABLET | Freq: Four times a day (QID) | ORAL | Status: DC | PRN

## 2018-10-01 MED ORDER — ONDANSETRON HCL 4 MG PO TABS: 4.0000 mg | ORAL_TABLET | Freq: Four times a day (QID) | ORAL | Status: DC | PRN

## 2018-10-01 MED ORDER — SODIUM CHLORIDE 0.9 % IV SOLN
INTRAVENOUS | Status: DC
  Administered 2018-10-01: via INTRAVENOUS

## 2018-10-01 MED ORDER — ESCITALOPRAM OXALATE 10 MG PO TABS
10.0000 mg | ORAL_TABLET | Freq: Every day | ORAL | Status: DC
  Administered 2018-10-04 – 2018-10-08 (×5): 10 mg via ORAL
  Filled 2018-10-01 (×5): qty 1

## 2018-10-01 MED ORDER — ACETAMINOPHEN 325 MG PO TABS: 650.0000 mg | ORAL_TABLET | Freq: Four times a day (QID) | ORAL | Status: DC | PRN

## 2018-10-01 MED ORDER — ACETAMINOPHEN 650 MG RE SUPP: 650.0000 mg | Freq: Four times a day (QID) | RECTAL | Status: DC | PRN

## 2018-10-01 MED ORDER — SODIUM CHLORIDE 0.9 % IV SOLN
INTRAVENOUS | Status: DC
  Administered 2018-10-01: 18:00:00 via INTRAVENOUS

## 2018-10-01 MED ORDER — ALBUTEROL SULFATE (2.5 MG/3ML) 0.083% IN NEBU: 2.5000 mg | INHALATION_SOLUTION | RESPIRATORY_TRACT | Status: DC | PRN

## 2018-10-01 MED ORDER — LEVOTHYROXINE SODIUM 100 MCG PO TABS: 100.0000 ug | ORAL_TABLET | Freq: Every day | ORAL | Status: DC

## 2018-10-01 MED ORDER — SODIUM CHLORIDE 0.9 % IV SOLN
1.0000 g | INTRAVENOUS | Status: DC
  Administered 2018-10-02 – 2018-10-07 (×7): 1 g via INTRAVENOUS
  Filled 2018-10-01 (×6): qty 10
  Filled 2018-10-01: qty 1

## 2018-10-01 MED ORDER — POLYETHYLENE GLYCOL 3350 17 G PO PACK: 17.0000 g | PACK | Freq: Two times a day (BID) | ORAL | Status: DC

## 2018-10-01 NOTE — ED Triage Notes (Signed)
[facility]- pt coming from home c/o AMS per family. Failure to thrive and decline in mental status over the last week. Recent UTI. Pt has hx of dementia but usually able to carry on conversation. Pt disoriented X4 on arrival.

## 2018-10-01 NOTE — ED Provider Notes (Signed)
[facility] Provider Note   CSN: [id number] Arrival date & time: 10/01/18  1355     History   Chief Complaint Chief Complaint  Patient presents with  . Altered Mental Status    HPI Natasha Beasley is a 85 y.o. female.  HPI Patient presents to the emergency room for evaluation of altered mental status.  Patient has a history of dementia and she lives with family members at home.  According to the EMS report family has noticed a decline in her mental status over the past week.  The patient usually is able to carry on a conversation but today she is just mumbling and not really able to answer.  Unknown if she is had any fevers.  Patient does not seem to be in any pain.  No known vomiting or diarrhea. Past Medical History:  Diagnosis Date  . Anemia   . Anxiety   . Chronic kidney disease    chronic kidney disease stage III  . Cognitive communication deficit   . Dementia (HCC)   . Depression   . Difficulty in walking   . Dysphagia    oropharyngeal phase  . Fracture of right femur (HCC)    neck of right femur   . Hx of falling   . Hypercholesteremia   . Hypertension   . Hypothyroidism   . Muscle weakness   . Nausea   . Other lack of coordination   . Presence of left artificial hip joint   . Thyroid disease     Patient Active Problem List   Diagnosis Date Noted  . S/P right hip hemi 12/07/2015  . Closed right hip fracture (HCC) 11/08/2015  . ARF (acute renal failure) (HCC) 11/08/2015  . Hypertension 11/08/2015  . HLD (hyperlipidemia) 11/08/2015  . Hypothyroidism 11/08/2015  . Hip fracture (HCC) 11/08/2015    Past Surgical History:  Procedure Laterality Date  . HIP ARTHROPLASTY Right 12/07/2015   Procedure: CONVERSION RIGHT FEMORAL NECK FRACTURE TO HEMI ARTHROPLASTY ;  Surgeon: Durene RomansMatthew Olin, MD;  Location: [facility] ORS;  Service: Orthopedics;  Laterality: Right;  . HIP PINNING,CANNULATED Right 11/09/2015   Procedure: CANNULATED HIP  PINNING;  Surgeon: Durene RomansMatthew Olin, MD;  [facility];  Service: Orthopedics;  Laterality: Right;  . HIP SURGERY       OB History   No obstetric history on file.      Home Medications    Prior to Admission medications   Medication Sig Start Date End Date Taking? Authorizing Provider  aspirin EC 325 MG EC tablet Take 1 tablet (325 mg total) by mouth 2 (two) times daily. 12/09/15   Lanney GinsBabish, Matthew, PA-C  atenolol (TENORMIN) 50 MG tablet Take 50 mg by mouth daily.      [provider]  cholecalciferol (VITAMIN D) 1000 units tablet Take 1,000 Units by mouth daily.    [provider]  docusate sodium (COLACE) 100 MG capsule Take 1 capsule (100 mg total) by mouth 2 (two) times daily. 12/09/15   Lanney GinsBabish, Matthew, PA-C  escitalopram (LEXAPRO) 10 MG tablet Take 10 mg by mouth daily.      [provider]  ferrous sulfate 325 (65 FE) MG tablet Take 1 tablet (325 mg total) by mouth 3 (three) times daily after meals. 12/09/15   Lanney GinsBabish, Matthew, PA-C  HYDROcodone-acetaminophen (NORCO/VICODIN) 5-325 MG tablet Take 1-2 tablets by mouth every 6 (six) hours as needed for moderate pain. Patient taking differently: Take 1 tablet by mouth every 6 (  six) hours as needed for moderate pain.  12/09/15   Lanney Gins, PA-C  levothyroxine (SYNTHROID, LEVOTHROID) 100 MCG tablet Take 100 mcg by mouth daily before breakfast. On an empty stomach    [provider]  LORazepam (ATIVAN) 1 MG tablet Take 1 mg by mouth every 6 (six) hours as needed for anxiety.    [provider]  losartan (COZAAR) 25 MG tablet Take 25 mg by mouth daily.    [provider]  polyethylene glycol (MIRALAX / GLYCOLAX) packet Take 17 g by mouth 2 (two) times daily. 12/09/15   Lanney Gins, PA-C  senna-docusate (SENOKOT-S) 8.6-50 MG tablet Take 1 tablet by mouth daily as needed for mild constipation.    [provider]    Family History Family History  Problem Relation Age of Onset    . Hypertension Son     Social History Social History   Tobacco Use  . Smoking status: Never Smoker  Substance Use Topics  . Alcohol use: No  . Drug use: No     Allergies   Patient has no known allergies.   Review of Systems Review of Systems  All other systems reviewed and are negative.    Physical Exam Updated Vital Signs BP (!) 135/52   Pulse 75   Temp 97.9 F (36.6 C) (Axillary)   Resp 17   SpO2 99%   Physical Exam Vitals signs and nursing note reviewed.  Constitutional:      General: She is not in acute distress.    Appearance: She is well-developed.     Comments: Thin, elderly, frail, well dressed  HENT:     Head: Normocephalic and atraumatic.     Right Ear: External ear normal.     Left Ear: External ear normal.  Eyes:     General: No scleral icterus.       Right eye: No discharge.        Left eye: No discharge.     Conjunctiva/sclera: Conjunctivae normal.  Neck:     Musculoskeletal: Neck supple.     Trachea: No tracheal deviation.  Cardiovascular:     Rate and Rhythm: Normal rate and regular rhythm.  Pulmonary:     Effort: Pulmonary effort is normal. No respiratory distress.     Breath sounds: Normal breath sounds. No stridor. No wheezing or rales.  Abdominal:     General: Bowel sounds are normal. There is no distension.     Palpations: Abdomen is soft.     Tenderness: There is no abdominal tenderness. There is no guarding or rebound.  Musculoskeletal:        General: No tenderness.  Skin:    General: Skin is warm and dry.     Findings: No rash.  Neurological:     Mental Status: She is alert.     GCS: GCS eye subscore is 4. GCS verbal subscore is 3. GCS motor subscore is 5.     Cranial Nerves: No cranial nerve deficit (no facial droop, extraocular movements intact, no slurred speech).     Sensory: No sensory deficit.     Motor: No abnormal muscle tone or seizure activity.     Coordination: Coordination normal.     Comments: Patient  moves all extremities, she intermittently squeeze my hand but does not really follow any commands, she said hello to me when I first walked in the room but otherwise none of her speech was intelligible      ED Treatments /  Results  Labs (all labs ordered are listed, but only abnormal results are displayed) Labs Reviewed  COMPREHENSIVE METABOLIC PANEL - Abnormal; Notable for the following components:      Result Value   Glucose, Bld 106 (*)    BUN 46 (*)    Creatinine, Ser 1.32 (*)    Calcium 12.6 (*)    Albumin 2.6 (*)    Total Bilirubin 1.6 (*)    GFR calc non Af Amer 35 (*)    GFR calc Af Amer 40 (*)    All other components within normal limits  CBC WITH DIFFERENTIAL/PLATELET - Abnormal; Notable for the following components:   WBC 23.5 (*)    RBC 3.82 (*)    Hemoglobin 11.2 (*)    Neutro Abs 20.9 (*)    Abs Immature Granulocytes 0.18 (*)    All other components within normal limits  PROTIME-INR - Abnormal; Notable for the following components:   Prothrombin Time 15.5 (*)    All other components within normal limits  TSH - Abnormal; Notable for the following components:   TSH 9.005 (*)    All other components within normal limits  APTT  AMMONIA  URINALYSIS, COMPLETE (UACMP) WITH MICROSCOPIC  CBG MONITORING, ED    EKG EKG Interpretation  Date/Time:  Monday October 01 2018 14:28:29 EST Ventricular Rate:  75 PR Interval:    QRS Duration: 144 QT Interval:  439 QTC Calculation: 491 R Axis:   83 Text Interpretation:  Sinus rhythm Right bundle branch block Anteroseptal infarct, age indeterminate Lateral leads are also involved No significant change since last tracing Confirmed by Natasha Beasley [phone]) on 10/01/2018 2:32:50 PM   Radiology Dg Chest 2 View  Result Date: 10/01/2018 CLINICAL DATA:  Altered mental status EXAM: CHEST - 2 VIEW COMPARISON:  11/08/2015 FINDINGS: The heart size and mediastinal contours are within normal limits. Both lungs are clear. The visualized  skeletal structures are unremarkable. IMPRESSION: No active cardiopulmonary disease. Electronically Signed   By: Deatra Robinson M.D.   On: 10/01/2018 16:01   Ct Head Wo Contrast  Result Date: 10/01/2018 CLINICAL DATA:  85 year old female with altered level of consciousness. EXAM: CT HEAD WITHOUT CONTRAST TECHNIQUE: Contiguous axial images were obtained from the base of the skull through the vertex without intravenous contrast. COMPARISON:  None. FINDINGS: Brain: Patchy bilateral white matter hypodensity. Symmetric bilateral dystrophic basal ganglia calcifications. Asymmetric and abnormal appearance of the right superior temporal lobe and frontal operculum best demonstrated on coronal image 40. No associated hemorrhage or regional mass effect. Elsewhere gray-white matter differentiation appears more normal. No midline shift, ventriculomegaly, mass effect, evidence of mass lesion. Vascular: Calcified atherosclerosis at the skull base. Skull: No acute osseous abnormality identified. Hyperostosis, normal variant. Carious maxillary dentition. Sinuses/Orbits: Paranasal sinuses, tympanic cavities and mastoids appear clear. Other: No acute orbit or scalp soft tissue finding. Negative visible noncontrast deep soft tissue spaces of the face. IMPRESSION: 1. Abnormal appearance of the right temporal and frontal operculum suspicious for recent infarct. Query left side symptoms. No associated hemorrhage or mass effect. Brain MRI recommended (noncontrast should suffice if the clinical picture also suggests infarct). 2. Bilateral white matter changes most commonly due to small vessel disease. 3. Carious maxillary dentition. Electronically Signed   By: Odessa Fleming M.D.   On: 10/01/2018 15:53    Procedures Procedures (including critical care time)  Medications Ordered in ED Medications  sodium chloride 0.9 % bolus 500 mL (500 mLs Intravenous New Bag/Given 10/01/18 1429)  And  0.9 %  sodium chloride infusion (has no  administration in time range)     Initial Impression / Assessment and Plan / ED Course  I have reviewed the triage vital signs and the nursing notes.  Pertinent labs & imaging results that were available during my care of the patient were reviewed by me and considered in my medical decision making (see chart for details).   Patient presented to the emergency room with altered mental status.  Patient has history of dementia.  Per family members over the last week or so she has had a change.  Her initial work-up is notable for an abnormal CT scan.  Cannot exclude stroke.  Patient also has a leukocytosis.  No pneumonia but a urinary tract infection is still a concern.  Her urinalysis is pending.  Plan on admission to the hospital for further work-up and evaluation.  Final Clinical Impressions(s) / ED Diagnoses   Final diagnoses:  Altered mental status, unspecified altered mental status type  Leukocytosis, unspecified type      Natasha Dibbles, MD 10/01/18 1626

## 2018-10-01 NOTE — Discharge Summary (Deleted)
HISTORY AND PHYSICAL       PATIENT DETAILS Name: Natasha Beasley Age: 85 y.o. Sex: female Date of Birth: 05/05/1925 Admit Date: 10/01/2018 ZOX:[id number]:Altheimer, Casimiro NeedleMichael, MD   Patient coming from: Home   CHIEF COMPLAINT:  Confusion, slurred speech (intermittent), unsteady gait, poor appetite-for approximately 1 week.  HPI: Natasha Beasley is a 85 y.o. female with medical history significant of with advanced dementia-lives at home with 24/7 care by caregivers, hypertension, dyslipidemia, hypothyroidism-presenting to the hospital for evaluation of the above noted complaints.  Please note-patient is a very poor historian-and not able to provide any history.  Most of this history is obtained after speaking with the patient's son at bedside and also with 1 of the patient's caregivers.  Apparently for the past 1 week or so-family/caregivers have noted slurred speech-that apparently is intermittent.  Along with the slurred speech-patient has been noted to have difficulty ambulating.  She normally is able to walk with the help of a walker.  She has not able to eat like her normal self-and at times the caregivers think she may have difficulty swallowing.  Family/caregivers have not noted any fever, cough, chest pain or shortness of breath-however they have noted very foul-smelling urine and dark-colored urine.    ED Course:  In the emergency room-patient was found to have leukocytosis of 23.5 thousand, mild AKI with a creatinine of 1.32.  Chest x-ray did not show any pneumonia.  CT head showed possible infarct.  The hospitalist service was then asked to admit this patient for further evaluation and treatment  Note: Lives at: Home with 24/7 care. Mobility: Cane/Walker Chronic Indwelling Foley:no   REVIEW OF SYSTEMS:  Unable to be obtained as patient confused and not able to participate in the history taking process   ALLERGIES:  No Known Allergies  PAST MEDICAL HISTORY: Past  Medical History:  Diagnosis Date  . Anemia   . Anxiety   . Chronic kidney disease    chronic kidney disease stage III  . Cognitive communication deficit   . Dementia (HCC)   . Depression   . Difficulty in walking   . Dysphagia    oropharyngeal phase  . Fracture of right femur (HCC)    neck of right femur   . Hx of falling   . Hypercholesteremia   . Hypertension   . Hypothyroidism   . Muscle weakness   . Nausea   . Other lack of coordination   . Presence of left artificial hip joint   . Thyroid disease     PAST SURGICAL HISTORY: Past Surgical History:  Procedure Laterality Date  . HIP ARTHROPLASTY Right 12/07/2015   Procedure: CONVERSION RIGHT FEMORAL NECK FRACTURE TO HEMI ARTHROPLASTY ;  Surgeon: Durene RomansMatthew Olin, MD;  Location: [facility];  Service: Orthopedics;  Laterality: Right;  . HIP PINNING,CANNULATED Right 11/09/2015   Procedure: CANNULATED HIP PINNING;  Surgeon: Durene RomansMatthew Olin, MD;  [facility];  Service: Orthopedics;  Laterality: Right;  . HIP SURGERY      MEDICATIONS AT HOME: Prior to Admission medications   Medication Sig Start Date End Date Taking? Authorizing Provider  aspirin EC 325 MG EC tablet Take 1 tablet (325 mg total) by mouth 2 (two) times daily. 12/09/15   Lanney GinsBabish, Matthew, PA-C  atenolol (TENORMIN) 50 MG tablet Take 50 mg by mouth daily.      [provider]  cholecalciferol (VITAMIN D) 1000 units tablet Take 1,000 Units by mouth daily.  [provider]  docusate sodium (COLACE) 100 MG capsule Take 1 capsule (100 mg total) by mouth 2 (two) times daily. 12/09/15   Lanney Gins, PA-C  escitalopram (LEXAPRO) 10 MG tablet Take 10 mg by mouth daily.      [provider]  ferrous sulfate 325 (65 FE) MG tablet Take 1 tablet (325 mg total) by mouth 3 (three) times daily after meals. 12/09/15   Lanney Gins, PA-C  HYDROcodone-acetaminophen (NORCO/VICODIN) 5-325 MG tablet Take 1-2 tablets by mouth every 6 (six) hours as needed for  moderate pain. Patient taking differently: Take 1 tablet by mouth every 6 (six) hours as needed for moderate pain.  12/09/15   Lanney Gins, PA-C  levothyroxine (SYNTHROID, LEVOTHROID) 100 MCG tablet Take 100 mcg by mouth daily before breakfast. On an empty stomach    [provider]  LORazepam (ATIVAN) 1 MG tablet Take 1 mg by mouth every 6 (six) hours as needed for anxiety.    [provider]  losartan (COZAAR) 25 MG tablet Take 25 mg by mouth daily.    [provider]  polyethylene glycol (MIRALAX / GLYCOLAX) packet Take 17 g by mouth 2 (two) times daily. 12/09/15   Lanney Gins, PA-C  senna-docusate (SENOKOT-S) 8.6-50 MG tablet Take 1 tablet by mouth daily as needed for mild constipation.    [provider]    FAMILY HISTORY: Family History  Problem Relation Age of Onset  . Hypertension Son     SOCIAL HISTORY:  reports that she has never smoked. She does not have any smokeless tobacco history on file. She reports that she does not drink alcohol or use drugs.  PHYSICAL EXAM: Blood pressure (!) 135/52, pulse 76, temperature 97.9 F (36.6 C), temperature source Axillary, resp. rate 17, SpO2 98 %.  General appearance :Awake, and pleasantly confused-speech does appear to be slurred at times.  Chronically sick appearing.  Very elderly. HEENT: Atraumatic and Normocephalic Neck: supple, no JVD. No cervical lymphadenopathy. No thyromegaly Resp:Good air entry bilaterally, no added sounds  CVS: S1 S2 regular, 3/6 systolic murmur.  GI: Bowel sounds present, Non tender and not distended with no gaurding, rigidity or rebound. Extremities: B/L Lower Ext shows no edema, both legs are warm to touch Neurology: Appears to be moving all 4 extremities-exam is limited by mental status. Musculoskeletal:gait appears to be normal.No digital cyanosis Skin:No Rash, warm and dry Wounds:N/A  LABS ON ADMISSION:  I have personally reviewed following labs and imaging  studies  CBC: Recent Labs  Lab 10/01/18 1405  WBC 23.5*  NEUTROABS 20.9*  HGB 11.2*  HCT 37.2  MCV 97.4  PLT 374    Basic Metabolic Panel: Recent Labs  Lab 10/01/18 1405  NA 139  K 4.6  CL 107  CO2 24  GLUCOSE 106*  BUN 46*  CREATININE 1.32*  CALCIUM 12.6*    GFR: CrCl cannot be calculated (Unknown ideal weight.).  Liver Function Tests: Recent Labs  Lab 10/01/18 1405  AST 21  ALT 16  ALKPHOS 83  BILITOT 1.6*  PROT 6.9  ALBUMIN 2.6*   No results for input(s): LIPASE, AMYLASE in the last 168 hours. Recent Labs  Lab 10/01/18 1405  AMMONIA 21    Coagulation Profile: Recent Labs  Lab 10/01/18 1405  INR 1.24    Cardiac Enzymes: No results for input(s): CKTOTAL, CKMB, CKMBINDEX, TROPONINI in the last 168 hours.  BNP (last 3 results) No results for input(s): PROBNP in the last 8760 hours.  HbA1C: No  results for input(s): HGBA1C in the last 72 hours.  CBG: Recent Labs  Lab 10/01/18 1432  GLUCAP 93    Lipid Profile: No results for input(s): CHOL, HDL, LDLCALC, TRIG, CHOLHDL, LDLDIRECT in the last 72 hours.  Thyroid Function Tests: Recent Labs    10/01/18 1405  TSH 9.005*    Anemia Panel: No results for input(s): VITAMINB12, FOLATE, FERRITIN, TIBC, IRON, RETICCTPCT in the last 72 hours.  Urine analysis:    Component Value Date/Time   COLORURINE YELLOW 11/08/2015 2355   APPEARANCEUR CLOUDY (A) 11/08/2015 2355   LABSPEC 1.016 11/08/2015 2355   PHURINE 5.5 11/08/2015 2355   GLUCOSEU NEGATIVE 11/08/2015 2355   HGBUR NEGATIVE 11/08/2015 2355   BILIRUBINUR NEGATIVE 11/08/2015 2355   KETONESUR NEGATIVE 11/08/2015 2355   PROTEINUR NEGATIVE 11/08/2015 2355   UROBILINOGEN 0.2 02/03/2010 1950   NITRITE NEGATIVE 11/08/2015 2355   LEUKOCYTESUR SMALL (A) 11/08/2015 2355    Sepsis Labs: Lactic Acid, Venous No results found for: LATICACIDVEN   Microbiology: No results found for this or any previous visit (from the past 240 hour(s)).     RADIOLOGIC STUDIES ON ADMISSION: Dg Chest 2 View  Result Date: 10/01/2018 CLINICAL DATA:  Altered mental status EXAM: CHEST - 2 VIEW COMPARISON:  11/08/2015 FINDINGS: The heart size and mediastinal contours are within normal limits. Both lungs are clear. The visualized skeletal structures are unremarkable. IMPRESSION: No active cardiopulmonary disease. Electronically Signed   By: Deatra Robinson M.D.   On: 10/01/2018 16:01   Ct Head Wo Contrast  Result Date: 10/01/2018 CLINICAL DATA:  85 year old female with altered level of consciousness. EXAM: CT HEAD WITHOUT CONTRAST TECHNIQUE: Contiguous axial images were obtained from the base of the skull through the vertex without intravenous contrast. COMPARISON:  None. FINDINGS: Brain: Patchy bilateral white matter hypodensity. Symmetric bilateral dystrophic basal ganglia calcifications. Asymmetric and abnormal appearance of the right superior temporal lobe and frontal operculum best demonstrated on coronal image 40. No associated hemorrhage or regional mass effect. Elsewhere gray-white matter differentiation appears more normal. No midline shift, ventriculomegaly, mass effect, evidence of mass lesion. Vascular: Calcified atherosclerosis at the skull base. Skull: No acute osseous abnormality identified. Hyperostosis, normal variant. Carious maxillary dentition. Sinuses/Orbits: Paranasal sinuses, tympanic cavities and mastoids appear clear. Other: No acute orbit or scalp soft tissue finding. Negative visible noncontrast deep soft tissue spaces of the face. IMPRESSION: 1. Abnormal appearance of the right temporal and frontal operculum suspicious for recent infarct. Query left side symptoms. No associated hemorrhage or mass effect. Brain MRI recommended (noncontrast should suffice if the clinical picture also suggests infarct). 2. Bilateral white matter changes most commonly due to small vessel disease. 3. Carious maxillary dentition. Electronically Signed   By: Odessa Fleming M.D.   On: 10/01/2018 15:53    I have personally reviewed images of chest xray   EKG:  Personally reviewed.  RBBB   ASSESSMENT AND PLAN: Acute encephalopathy: Per family-patient's confusion is worse than usual baseline-apart from mild AKI-really no other metabolic abnormalities.  CT head shows possible CVA-but apart from mild intermittent dysarthria-she really does not have focal deficits.  Will obtain MRI brain-and follow clinical course.  Family aware that given her advanced dementia-she is at risk for delirium during this hospital stay.  Leukocytosis: No other foci of infection apparent-she does not appear toxic-afebrile-family does acknowledge that patient urine is very dark in color and very foul-smelling.  Will obtain UA, urine culture-blood cultures will be obtained.  Will empirically start Rocephin.  If  cultures are negative-all antimicrobial therapy could be discontinued.  If she indeed does have a CVA-then leukocytosis could be reactive to that.  Abnormal CT scan of the head: Some mild dysarthria that is intermittent-really no other focal deficits-CT head with possible CVA-if so this is already at least 491 week old.  Will obtain MRI brain-if it does confirm acute/subacute CVA-we can then contemplate further work-up.  However patient is 85 years old-and is very frail and chronically sick appearing-not sure if further work-up will change management outcome in this case.   Mild AKI: Likely hemodynamically mediated-cautiously hydrate and recheck electrolytes tomorrow.  Dementia with delirium: Per family/caregivers-patient has sundowners in the evening even at home-at baseline-she is just about able to walk with help of a walker, and needs some assistance with feeding.  She is dependent on her caregivers for significant activities of daily living.  Will continue Lexapro and as needed lorazepam.  Family aware that patient is at risk for significant delirium during this hospital  stay.  Hypertension: Continue atenolol-follow.  Hypothyroidism: Continue Synthroid  Failure to thrive syndrome: Suspect has significant amount of debility and frailty at baseline.  Will obtain PT/OT/SLP evaluation.  Per patient's caregiver/family-there has been a market decline in her functional status over the past 1 week-much more than her usual baseline-previously she was able to ambulate with the help of a walker-currently she is unable.  [facility].  Advanced directives: Patient son at bedside-who claims he is HPOA-agrees to a DNR order.  Further plan will depend as patient's clinical course evolves and further radiologic and laboratory data become available. Patient will be monitored closely.  Above noted plan was discussed with patient's son and caregiver face to face at bedside, they were in agreement.   CONSULTS: None  DVT Prophylaxis: Prophylactic Lovenox  Code Status: DNR  Disposition Plan:  Discharge back home vs SNF possibly in 2-3 days, depending on clinical course  Admission status: Inpatient  going to tele  The medical decision making on this patient was of high complexity and the patient is at high risk for clinical deterioration, therefore this is a level 3 visit.   Total time spent  55 minutes.Greater than 50% of this time was spent in counseling, explanation of diagnosis, planning of further management, and coordination of care.  Severity of Illness: The appropriate patient status for this patient is INPATIENT. Inpatient status is judged to be reasonable and necessary in order to provide the required intensity of service to ensure the patient's safety. The patient's presenting symptoms, physical exam findings, and initial radiographic and laboratory data in the context of their chronic comorbidities is felt to place them at high risk for further clinical deterioration. Furthermore, it is not anticipated that the patient  will be medically stable for discharge from the hospital within 2 midnights of admission. The following factors support the patient status of inpatient.   " The patient's presenting symptoms include worsening confusion, intermittent dysarthria, debility. " The worrisome physical exam findings include dysarthria. " The initial radiographic and laboratory data are worrisome because of possible CVA on CT head " The chronic co-morbidities include advanced dementia, hypertension   * I certify that at the point of admission it is my clinical judgment that the patient will require inpatient hospital care spanning beyond 2 midnights from the point of admission due to high intensity of service, high risk for further deterioration and high frequency of surveillance required.**  Nhu Glasby Beasley  [facility] Pager [phone]  If 7PM-7AM, please contact night-coverage [email or web address] Password [facility] 10/01/2018, 4:47 PM

## 2018-10-02 ENCOUNTER — Other Ambulatory Visit: Payer: Self-pay

## 2018-10-02 ENCOUNTER — Inpatient Hospital Stay (HOSPITAL_COMMUNITY): Payer: Medicare Other

## 2018-10-02 DIAGNOSIS — R4182 Altered mental status, unspecified: Secondary | ICD-10-CM

## 2018-10-02 DIAGNOSIS — E785 Hyperlipidemia, unspecified: Secondary | ICD-10-CM

## 2018-10-02 DIAGNOSIS — N179 Acute kidney failure, unspecified: Secondary | ICD-10-CM | POA: Diagnosis present

## 2018-10-02 DIAGNOSIS — R410 Disorientation, unspecified: Secondary | ICD-10-CM | POA: Diagnosis present

## 2018-10-02 DIAGNOSIS — E039 Hypothyroidism, unspecified: Secondary | ICD-10-CM

## 2018-10-02 DIAGNOSIS — I10 Essential (primary) hypertension: Secondary | ICD-10-CM

## 2018-10-02 LAB — GLUCOSE, CAPILLARY
Glucose-Capillary: 90 mg/dL (ref 70–99)
Glucose-Capillary: 90 mg/dL (ref 70–99)
Glucose-Capillary: 92 mg/dL (ref 70–99)

## 2018-10-02 LAB — CBC
HCT: 34.4 % — ABNORMAL LOW (ref 36.0–46.0)
HEMOGLOBIN: 10.8 g/dL — AB (ref 12.0–15.0)
MCH: 30.2 pg (ref 26.0–34.0)
MCHC: 31.4 g/dL (ref 30.0–36.0)
MCV: 96.1 fL (ref 80.0–100.0)
Platelets: 340 10*3/uL (ref 150–400)
RBC: 3.58 MIL/uL — ABNORMAL LOW (ref 3.87–5.11)
RDW: 13.7 % (ref 11.5–15.5)
WBC: 18.4 10*3/uL — ABNORMAL HIGH (ref 4.0–10.5)
nRBC: 0 % (ref 0.0–0.2)

## 2018-10-02 LAB — BASIC METABOLIC PANEL
Anion gap: 8 (ref 5–15)
BUN: 43 mg/dL — ABNORMAL HIGH (ref 8–23)
CO2: 23 mmol/L (ref 22–32)
Calcium: 12 mg/dL — ABNORMAL HIGH (ref 8.9–10.3)
Chloride: 111 mmol/L (ref 98–111)
Creatinine, Ser: 1.2 mg/dL — ABNORMAL HIGH (ref 0.44–1.00)
GFR calc Af Amer: 45 mL/min — ABNORMAL LOW (ref >60)
GFR calc non Af Amer: 39 mL/min — ABNORMAL LOW (ref >60)
Glucose, Bld: 100 mg/dL — ABNORMAL HIGH (ref 70–99)
Potassium: 4.3 mmol/L (ref 3.5–5.1)
Sodium: 142 mmol/L (ref 135–145)

## 2018-10-02 LAB — LIPID PANEL
Cholesterol: 163 mg/dL (ref 0–200)
HDL: 25 mg/dL — ABNORMAL LOW (ref >40)
LDL Cholesterol: 108 mg/dL — ABNORMAL HIGH (ref 0–99)
Total CHOL/HDL Ratio: 6.5 RATIO
Triglycerides: 150 mg/dL — ABNORMAL HIGH (ref <150)
VLDL: 30 mg/dL (ref 0–40)

## 2018-10-02 LAB — HEMOGLOBIN A1C
Hgb A1c MFr Bld: 5.3 % (ref 4.8–5.6)
Mean Plasma Glucose: 105.41 mg/dL

## 2018-10-02 MED ORDER — SODIUM CHLORIDE 0.9 % IV SOLN
INTRAVENOUS | Status: AC
  Administered 2018-10-02 (×2): via INTRAVENOUS

## 2018-10-02 NOTE — Evaluation (Signed)
Clinical/Bedside Swallow Evaluation Patient Details  Name: Natasha Beasley MRN: [id number] Date of Birth: 01/02/1925  Today's Date: 10/02/2018 Time: SLP Start Time (ACUTE ONLY): 0840 SLP Stop Time (ACUTE ONLY): 0855 SLP Time Calculation (min) (ACUTE ONLY): 15 min  Past Medical History:  Past Medical History:  Diagnosis Date  . Anemia   . Anxiety   . Chronic kidney disease    chronic kidney disease stage III  . Cognitive communication deficit   . Dementia (HCC)   . Depression   . Difficulty in walking   . Dysphagia    oropharyngeal phase  . Fracture of right femur (HCC)    neck of right femur   . Hx of falling   . Hypercholesteremia   . Hypertension   . Hypothyroidism   . Muscle weakness   . Nausea   . Other lack of coordination   . Presence of left artificial hip joint   . Thyroid disease    Past Surgical History:  Past Surgical History:  Procedure Laterality Date  . HIP ARTHROPLASTY Right 12/07/2015   Procedure: CONVERSION RIGHT FEMORAL NECK FRACTURE TO HEMI ARTHROPLASTY ;  Surgeon: Durene RomansMatthew Olin, MD;  Location: [facility];  Service: Orthopedics;  Laterality: Right;  . HIP PINNING,CANNULATED Right 11/09/2015   Procedure: CANNULATED HIP PINNING;  Surgeon: Durene RomansMatthew Olin, MD;  [facility];  Service: Orthopedics;  Laterality: Right;  . HIP SURGERY     HPI:  Patient presents to the emergency room for evaluation of altered mental status.  Patient has a history of dementia and she lives with family members at home. Abnormal appearance of the right temporal and frontal operculum suspicious for recent infarct. Query left side symptoms. Bilateral white matter changes most commonly due to small vessel disease. MRI pending.    Assessment / Plan / Recommendation Clinical Impression  Pt presents with increased risk of aspiration with any PO's presented during this BSE. Pt currently presents with likely cognitive based dysphagia that is c/b significantly decreased oral awareness and  attention to any PO's presented. With Total tactile and verbal cues, pt able to receive minimal amounts of PO (ice chip x 1, thin liquids via straw sip x 1, nectar via spoon x 2, puree x 2). Pt with immediate coughing when consuming thins and one swallow with nectar and puree that appeared appropriate at bedside. However pt with extensive oral holding of second spoonful of nectar thick liquids and puree that required oral suctioning to remove. Pt was not responsive to any cues to swallow and held last boluses in her mouth with eyes closed. This Clinical research associatewriter spoke with MD regarding recommendation for pt to continue NPO status and education provided that pt isn't a candidate for instrumental study at this time. ST will follow acutely but should pt's family consider comfort feeds, would recommend puree with nectar thick liquids via spoon with oral suctioning present during periods of the day when pt is alert.  SLP Visit Diagnosis: Dysphagia, oropharyngeal phase (R13.12)    Aspiration Risk  Severe aspiration risk;Risk for inadequate nutrition/hydration    Diet Recommendation NPO   Medication Administration: Via alternative means    Other  Recommendations Oral Care Recommendations: Oral care QID   Follow up Recommendations Skilled Nursing facility(possibility of Palliative care consult)      Frequency and Duration min 2x/week  2 weeks       Prognosis Prognosis for Safe Diet Advancement: Guarded Barriers to Reach Goals: Cognitive deficits;Severity of deficits      Swallow  Study   General Date of Onset: 10/01/18 HPI: Patient presents to the emergency room for evaluation of altered mental status.  Patient has a history of dementia and she lives with family members at home. Abnormal appearance of the right temporal and frontal operculum suspicious for recent infarct. Query left side symptoms. Bilateral white matter changes most commonly due to small vessel disease. MRI pending.  Type of Study: Bedside  Swallow Evaluation Previous Swallow Assessment: (none in chart) Diet Prior to this Study: NPO Temperature Spikes Noted: No Respiratory Status: Room air History of Recent Intubation: No Behavior/Cognition: Confused;Uncooperative;Requires cueing;Doesn't follow directions Oral Cavity Assessment: Within Functional Limits Oral Care Completed by SLP: Yes Oral Cavity - Dentition: Adequate natural dentition Self-Feeding Abilities: Total assist;Refused PO Patient Positioning: Upright in bed Baseline Vocal Quality: Normal Volitional Cough: Cognitively unable to elicit Volitional Swallow: Unable to elicit    Oral/Motor/Sensory Function Overall Oral Motor/Sensory Function: (with minimal PO intake, no focal impairments observed)   Ice Chips Ice chips: Impaired Presentation: Spoon Oral Phase Impairments: Reduced lingual movement/coordination;Poor awareness of bolus;Impaired mastication Oral Phase Functional Implications: Oral holding;Prolonged oral transit Other Comments: Pt with continuous attempts to talk while holding ice chip in mouth   Thin Liquid Thin Liquid: Impaired Presentation: Straw Oral Phase Impairments: Poor awareness of bolus Oral Phase Functional Implications: Oral holding Pharyngeal  Phase Impairments: Cough - Immediate Other Comments: (pt not responsive to cup at lips, but able to draw liquid th)    Nectar Thick Nectar Thick Liquid: Impaired Presentation: Spoon(fed by SLP) Oral Phase Impairments: Poor awareness of bolus;Impaired mastication;Reduced lingual movement/coordination Oral phase functional implications: Prolonged oral transit;Oral residue;Oral holding Other Comments: pt required suctioning of boluses   Honey Thick Honey Thick Liquid: Not tested   Puree Puree: Impaired Presentation: Spoon(fed by SLP) Oral Phase Impairments: Impaired mastication;Reduced lingual movement/coordination;Poor awareness of bolus Oral Phase Functional Implications: Oral holding;Oral  residue;Prolonged oral transit Other Comments: oral suctioning required    Solid     Solid: Not tested      Natasha Beasley 10/02/2018,10:48 AM

## 2018-10-02 NOTE — Evaluation (Signed)
Physical Therapy Evaluation Patient Details Name: Natasha BjornstadJuanita Beasley MRN: [id number] DOB: 09/19/1925 Today's Date: 10/02/2018   History of Present Illness  Patient is a 85 y/o female who presents with AMS and slurred speech. Head CT-Abnormal appearance of the right temporal and frontal operculum suspicious for recent infart. PMH includes dementia, HTN, CKD, left THA, right hemiarthroplasty.  Clinical Impression  Patient presents with expressive speech difficulties and inability to follow commands s/p above. Pt not able to provide PLOF/history due to hx of dementia. Requires Max A to get to/from EOB with pt resisting movement and wanting to return to supine. Pt not following any commands today. Per chart, pt was ambulatory with RW at baseline and has 24/7 caregivers at home. If family/caregivers are able to care for pt, recommend return home. If not, recommend SNF. Will follow acutely to maximize independence and mobility prior to discharge.    Follow Up Recommendations SNF;Supervision for mobility/OOB    Equipment Recommendations  None recommended by PT    Recommendations for Other Services       Precautions / Restrictions Precautions Precautions: Fall Restrictions Weight Bearing Restrictions: No      Mobility  Bed Mobility Overal bed mobility: Needs Assistance Bed Mobility: Supine to Sit;Sit to Supine     Supine to sit: Max assist;HOB elevated Sit to supine: Max assist;HOB elevated   General bed mobility comments: Assist with LEs, bottom and to elevate trunk to get to EOB. Pt resisting therapist once seated EOB to try to lay down.  Transfers                 General transfer comment: Deferred as pt continually trying to return to supine  Ambulation/Gait                Stairs            Wheelchair Mobility    Modified Rankin (Stroke Patients Only) Modified Rankin (Stroke Patients Only) Pre-Morbid Rankin Score: Moderately severe disability Modified  Rankin: Moderately severe disability     Balance                                             Pertinent Vitals/Pain Pain Assessment: Faces Faces Pain Scale: No hurt    Home Living Family/patient expects to be discharged to:: Skilled nursing facility                 Additional Comments: Pt not able to provide information secondary to advanced dementia and speech difficulties.     Prior Function Level of Independence: Needs assistance         Comments: Per chart, pt has 24/7 caregivers at home and uses RW for ambulation.     Hand Dominance        Extremity/Trunk Assessment   Upper Extremity Assessment Upper Extremity Assessment: Defer to OT evaluation    Lower Extremity Assessment Lower Extremity Assessment: Generalized weakness;Difficult to assess due to impaired cognition    Cervical / Trunk Assessment Cervical / Trunk Assessment: Kyphotic  Communication   Communication: Expressive difficulties  Cognition Arousal/Alertness: Lethargic(intermittently ) Behavior During Therapy: Flat affect Overall Cognitive Status: Difficult to assess                                 General Comments: not following any commands. Not  able to understand any speech. per baseline in chart, pt can hold a conversation.      General Comments      Exercises     Assessment/Plan    PT Assessment Patient needs continued PT services  PT Problem List Decreased strength;Decreased balance;Decreased cognition;Decreased safety awareness;Decreased mobility       PT Treatment Interventions Balance training;Patient/family education;Functional mobility training;DME instruction;Gait training;Therapeutic activities;Neuromuscular re-education;Therapeutic exercise;Stair training    PT Goals (Current goals can be found in the Care Plan section)  Acute Rehab PT Goals Patient Stated Goal: none stated as pt unable PT Goal Formulation: Patient unable to  participate in goal setting Time For Goal Achievement: 10/16/18 Potential to Achieve Goals: Fair    Frequency Min 2X/week   Barriers to discharge        Co-evaluation               AM-PAC PT "6 Clicks" Mobility  Outcome Measure Help needed turning from your back to your side while in a flat bed without using bedrails?: A Lot Help needed moving from lying on your back to sitting on the side of a flat bed without using bedrails?: A Lot Help needed moving to and from a bed to a chair (including a wheelchair)?: A Lot Help needed standing up from a chair using your arms (e.g., wheelchair or bedside chair)?: A Lot Help needed to walk in hospital room?: A Little Help needed climbing 3-5 steps with a railing? : Total 6 Click Score: 12    End of Session   Activity Tolerance: Patient limited by lethargy Patient left: in bed;with call bell/phone within reach;with bed alarm set Nurse Communication: Mobility status PT Visit Diagnosis: Muscle weakness (generalized) (M62.81);Difficulty in walking, not elsewhere classified (R26.2)    Time: [id number] PT Time Calculation (min) (ACUTE ONLY): 12 min   Charges:   PT Evaluation $PT Eval Moderate Complexity: 1 Mod          Mylo RedShauna Travell Desaulniers, PT, DPT Acute Rehabilitation Services Pager [phone] Office [phone]      Blake DivineShauna A Lanier EnsignHartshorne 10/02/2018, 12:52 PM

## 2018-10-02 NOTE — Progress Notes (Signed)
PROGRESS NOTE  Natasha BjornstadJuanita Beasley ZOX:[id number] DOB: 03/27/1925 DOA: 10/01/2018 PCP: Hali MarryAltheimer, Michael, MD  HPI/Brief Narrative  Natasha BjornstadJuanita Beasley is a 85 y.o. year old female with medical history significant for advanced dementia (lives at home with 24/7 care by caregivers), HTN, HLD, hypothyroidism who presented on 10/01/2018 with unsteady gait, slurred speech, diminished appetite for the past 1 to 2 weeks and was found to have abnormal CT head imaging, hypercalcemia, AKI, presumed UTI on Rocephin.  Patient currently awaiting MRI brain for further characterization.    Assessment/Plan:  Acute encephalopathy in patient with known dementia.  Family reports new onset weakness, slurred speech over the past week different from her typical baseline of being able to ambulate and communicate though she does have known dementia with episodes of confusion.  Differential includes stroke though no focal deficits on exam (no abnormalities found on CT head mention below),  infection (UTI), hypercalcemia in setting of dehydration, elevated TSH.  Encouragingly glucose, ammonia are within normal limits.  Continue neurochecks.  Continue neurochecks  Abnormal appearance of right temporal frontal area on CT head.  Concern for infarct especially in setting of new symptoms and decline from baseline over the past week.  Family still wants to proceed with MRI brain and potential DAPT therapy if needed.  Due to altered mental status patient unable to remain still, will need to use medication prior  Hypercalcemia, mild.  Calcium elevated at 12 in the setting of diminished p.o. intake related to changes in mental status.  Increase IV fluid rate, follow CMP.  Abnormal UA, concern for UTI.  Patient family does report malodorous urine and history of chronic UTIs.  Empirically on IV Rocephin, follow urine culture.  Leukocytosis, improving.  Peak of 23.5 proven down to 18.  Likely stress leukocytosis but could also be related to  potential UTI.  Blood cultures pending we will continue to closely monitor.  AKI, slowly improving.  Most recent baseline creatinine within normal limits.  On admission creatinine 1.32, currently 1.2.  Likely prerenal etiology related to dehydration, continue IV fluids, monitor BMP.  Avoid nephrotoxins  Hypothyroidism.  TSH elevated at 9, unclear if patient was adherent with Synthroid, will continue 100 mcg Synthroid.  Hypertension, stable.  Continue home atenolol  Failure to thrive Very thin female and frail.  Family reports only 3 days of diminished oral intake but suspect this is been going on for longer.  Per speech evaluation patient to remain n.p.o. is unsafe to tolerate oral diet.  Family aware and would like to see if any of her symptoms are reversible in the next 24 to 40 hours before considering potential comfort care measures.     Cultures:  10/02/2018 blood cultures pending  1/14 urine cultures pending  Telemetry:yes  DVT prophylaxis: Consultants:  None    Procedures:  None  Antimicrobials: IV Rocephin 1/13--  Code Status: DNR, confirmed by son Carollee MassedJimmy Bulkley at bedside  Family Communication: Discussed with son at bedside, Carollee MassedJimmy Viloria  Disposition Plan: Needs brain MRI for further evaluation of CT head abnormality, correction of metabolic abnormalities including hypercalcemia and AKI, improved treatment for presumed UTI.  Close monitoring of mental status.      Subjective No acute events overnight  Objective: Vitals:   10/02/18 0030 10/02/18 0200 10/02/18 0400 10/02/18 0831  BP: (!) 105/35 (!) 104/43 (!) 108/53 (!) 102/47  Pulse: 72  71 79  Resp: 17 16 17 17   Temp: 98 F (36.7 C) (!) 97.5 F (36.4 C) 98.4 F (  36.9 C) 97.6 F (36.4 C)  TempSrc: Axillary Axillary Axillary Axillary  SpO2: 98% 97% 97% 96%  Weight:      Height:        Intake/Output Summary (Last 24 hours) at 10/02/2018 1328 Last data filed at 10/02/2018 0600 Gross per 24 hour    Intake 275.41 ml  Output 200 ml  Net 75.41 ml   Filed Weights   10/01/18 2030  Weight: 41.2 kg    Exam:  Constitutional: Thin, frail elderly female, lying in bed, no distress Eyes: EOMI, anicteric, normal conjunctivae ENMT: Oropharynx with dry mucous membranes, poor dentition Cardiovascular: RRR no MRGs, with no peripheral edema Respiratory: Normal respiratory effort on room air, clear breath sounds  Abdomen: Soft,non-tender, with no HSM Skin: No rash ulcers, or lesions. Without skin tenting  Neurologic: Speaking incoherently, no obviously slurred speech, not following commands consistently on exam but moving all extremities with no obvious focal deficits Psychiatric: Confused, disoriented Data Reviewed: CBC: Recent Labs  Lab 10/01/18 1405 10/02/18 0510  WBC 23.5* 18.4*  NEUTROABS 20.9*  --   HGB 11.2* 10.8*  HCT 37.2 34.4*  MCV 97.4 96.1  PLT 374 340   Basic Metabolic Panel: Recent Labs  Lab 10/01/18 1405 10/02/18 0510  NA 139 142  K 4.6 4.3  CL 107 111  CO2 24 23  GLUCOSE 106* 100*  BUN 46* 43*  CREATININE 1.32* 1.20*  CALCIUM 12.6* 12.0*   GFR: Estimated Creatinine Clearance: 19.5 mL/min (A) (by C-G formula based on SCr of 1.2 mg/dL (H)). Liver Function Tests: Recent Labs  Lab 10/01/18 1405  AST 21  ALT 16  ALKPHOS 83  BILITOT 1.6*  PROT 6.9  ALBUMIN 2.6*   No results for input(s): LIPASE, AMYLASE in the last 168 hours. Recent Labs  Lab 10/01/18 1405  AMMONIA 21   Coagulation Profile: Recent Labs  Lab 10/01/18 1405  INR 1.24   Cardiac Enzymes: No results for input(s): CKTOTAL, CKMB, CKMBINDEX, TROPONINI in the last 168 hours. BNP (last 3 results) No results for input(s): PROBNP in the last 8760 hours. HbA1C: Recent Labs    10/02/18 0510  HGBA1C 5.3   CBG: Recent Labs  Lab 10/01/18 1432 10/02/18 0012 10/02/18 0344 10/02/18 0827  GLUCAP 93 90 92 90   Lipid Profile: Recent Labs    10/02/18 0510  CHOL 163  HDL 25*   LDLCALC 108*  TRIG 150*  CHOLHDL 6.5   Thyroid Function Tests: Recent Labs    10/01/18 1405  TSH 9.005*   Anemia Panel: No results for input(s): VITAMINB12, FOLATE, FERRITIN, TIBC, IRON, RETICCTPCT in the last 72 hours. Urine analysis:    Component Value Date/Time   COLORURINE AMBER (A) 10/01/2018 1859   APPEARANCEUR TURBID (A) 10/01/2018 1859   LABSPEC 1.020 10/01/2018 1859   PHURINE 8.0 10/01/2018 1859   GLUCOSEU NEGATIVE 10/01/2018 1859   HGBUR MODERATE (A) 10/01/2018 1859   BILIRUBINUR NEGATIVE 10/01/2018 1859   KETONESUR NEGATIVE 10/01/2018 1859   PROTEINUR >=300 (A) 10/01/2018 1859   UROBILINOGEN 0.2 02/03/2010 1950   NITRITE NEGATIVE 10/01/2018 1859   LEUKOCYTESUR MODERATE (A) 10/01/2018 1859   Sepsis Labs: @LABRCNTIP (procalcitonin:4,lacticidven:4)  )No results found for this or any previous visit (from the past 240 hour(s)).    Studies: Dg Chest 2 View  Result Date: 10/01/2018 CLINICAL DATA:  Altered mental status EXAM: CHEST - 2 VIEW COMPARISON:  11/08/2015 FINDINGS: The heart size and mediastinal contours are within normal limits. Both lungs are clear. The visualized skeletal  structures are unremarkable. IMPRESSION: No active cardiopulmonary disease. Electronically Signed   By: Deatra Robinson M.D.   On: 10/01/2018 16:01   Ct Head Wo Contrast  Result Date: 10/01/2018 CLINICAL DATA:  85 year old female with altered level of consciousness. EXAM: CT HEAD WITHOUT CONTRAST TECHNIQUE: Contiguous axial images were obtained from the base of the skull through the vertex without intravenous contrast. COMPARISON:  None. FINDINGS: Brain: Patchy bilateral white matter hypodensity. Symmetric bilateral dystrophic basal ganglia calcifications. Asymmetric and abnormal appearance of the right superior temporal lobe and frontal operculum best demonstrated on coronal image 40. No associated hemorrhage or regional mass effect. Elsewhere gray-white matter differentiation appears more  normal. No midline shift, ventriculomegaly, mass effect, evidence of mass lesion. Vascular: Calcified atherosclerosis at the skull base. Skull: No acute osseous abnormality identified. Hyperostosis, normal variant. Carious maxillary dentition. Sinuses/Orbits: Paranasal sinuses, tympanic cavities and mastoids appear clear. Other: No acute orbit or scalp soft tissue finding. Negative visible noncontrast deep soft tissue spaces of the face. IMPRESSION: 1. Abnormal appearance of the right temporal and frontal operculum suspicious for recent infarct. Query left side symptoms. No associated hemorrhage or mass effect. Brain MRI recommended (noncontrast should suffice if the clinical picture also suggests infarct). 2. Bilateral white matter changes most commonly due to small vessel disease. 3. Carious maxillary dentition. Electronically Signed   By: Odessa Fleming M.D.   On: 10/01/2018 15:53    Scheduled Meds: . atenolol  50 mg Oral Daily  . cholecalciferol  1,000 Units Oral Daily  . enoxaparin (LOVENOX) injection  20 mg Subcutaneous Q24H  . escitalopram  10 mg Oral Daily  . ferrous sulfate  325 mg Oral TID PC  . levothyroxine  100 mcg Oral Q0600    Continuous Infusions: . sodium chloride 75 mL/hr at 10/02/18 0815  . cefTRIAXone (ROCEPHIN)  IV Stopped (10/02/18 0826)     LOS: 1 day     Laverna Peace, MD [facility]

## 2018-10-02 NOTE — Plan of Care (Signed)
  Problem: Education: Goal: Knowledge of disease or condition will improve Outcome: Progressing Goal: Knowledge of secondary prevention will improve Outcome: Progressing Goal: Knowledge of patient specific risk factors addressed and post discharge goals established will improve Outcome: Progressing Goal: Individualized Educational Video(s) Outcome: Progressing   Problem: Coping: Goal: Will verbalize positive feelings about self Outcome: Progressing   Problem: Health Behavior/Discharge Planning: Goal: Ability to manage health-related needs will improve Outcome: Progressing   Problem: Self-Care: Goal: Ability to participate in self-care as condition permits will improve Outcome: Progressing Goal: Verbalization of feelings and concerns over difficulty with self-care will improve Outcome: Progressing Goal: Ability to communicate needs accurately will improve Outcome: Progressing   Problem: Nutrition: Goal: Risk of aspiration will decrease Outcome: Progressing Goal: Dietary intake will improve Outcome: Progressing   Problem: Ischemic Stroke/TIA Tissue Perfusion: Goal: Complications of ischemic stroke/TIA will be minimized Outcome: Progressing

## 2018-10-02 NOTE — H&P (Signed)
HISTORY AND PHYSICAL       PATIENT DETAILS Name: Natasha Beasley Age: 85 y.o. Sex: female Date of Birth: [birth year] Admit Date: 10/01/2018 [id number], Casimiro Needle, MD   Patient coming from: Home   CHIEF COMPLAINT:  Confusion, slurred speech (intermittent), unsteady gait, poor appetite-for approximately 1 week.  HPI: Natasha Beasley is a 85 y.o. female with medical history significant of with advanced dementia-lives at home with 24/7 care by caregivers, hypertension, dyslipidemia, hypothyroidism-presenting to the hospital for evaluation of the above noted complaints.  Please note-patient is a very poor historian-and not able to provide any history.  Most of this history is obtained after speaking with the patient's son at bedside and also with 1 of the patient's caregivers.  Apparently for the past 1 week or so-family/caregivers have noted slurred speech-that apparently is intermittent.  Along with the slurred speech-patient has been noted to have difficulty ambulating.  She normally is able to walk with the help of a walker.  She has not able to eat like her normal self-and at times the caregivers think she may have difficulty swallowing.  Family/caregivers have not noted any fever, cough, chest pain or shortness of breath-however they have noted very foul-smelling urine and dark-colored urine.    ED Course:  In the emergency room-patient was found to have leukocytosis of 23.5 thousand, mild AKI with a creatinine of 1.32.  Chest x-ray did not show any pneumonia.  CT head showed possible infarct.  The hospitalist service was then asked to admit this patient for further evaluation and treatment  Note: Lives at: Home with 24/7 care. Mobility: Cane/Walker Chronic Indwelling Foley:no   REVIEW OF SYSTEMS:  Unable to be obtained as patient confused and not able to participate in the history taking process   ALLERGIES:   Allergies  Allergen Reactions  .  Hydrocodone-Acetaminophen Other (See Comments)    Hallucinations (reported by [facility] 10/03/16)    PAST MEDICAL HISTORY: Past Medical History:  Diagnosis Date  . Anemia   . Anxiety   . Chronic kidney disease    chronic kidney disease stage III  . Cognitive communication deficit   . Dementia (HCC)   . Depression   . Difficulty in walking   . Dysphagia    oropharyngeal phase  . Fracture of right femur (HCC)    neck of right femur   . Hx of falling   . Hypercholesteremia   . Hypertension   . Hypothyroidism   . Muscle weakness   . Nausea   . Other lack of coordination   . Presence of left artificial hip joint   . Thyroid disease     PAST SURGICAL HISTORY: Past Surgical History:  Procedure Laterality Date  . HIP ARTHROPLASTY Right 12/07/2015   Procedure: CONVERSION RIGHT FEMORAL NECK FRACTURE TO HEMI ARTHROPLASTY ;  Surgeon: Durene Romans, MD;  Location: [facility];  Service: Orthopedics;  Laterality: Right;  . HIP PINNING,CANNULATED Right 11/09/2015   Procedure: CANNULATED HIP PINNING;  Surgeon: Durene Romans, MD;  Location: [facility] OR;  Service: Orthopedics;  Laterality: Right;  . HIP SURGERY      MEDICATIONS AT HOME: Prior to Admission medications   Medication Sig Start Date End Date Taking? Authorizing Provider  aspirin EC 325 MG EC tablet Take 1 tablet (325 mg total) by mouth 2 (two) times daily. 12/09/15   Lanney Gins, PA-C  atenolol (TENORMIN) 50 MG tablet Take 50 mg by mouth daily.      [provider]  cholecalciferol (VITAMIN D) 1000 units tablet Take 1,000 Units by mouth daily.    [provider]  docusate sodium (COLACE) 100 MG capsule Take 1 capsule (100 mg total) by mouth 2 (two) times daily. 12/09/15   Lanney GinsBabish, Matthew, PA-C  escitalopram (LEXAPRO) 10 MG tablet Take 10 mg by mouth daily.      [provider]  ferrous sulfate 325 (65 FE) MG tablet Take 1 tablet (325 mg total) by mouth 3 (three) times daily after meals. 12/09/15   Lanney GinsBabish, Matthew,  PA-C  HYDROcodone-acetaminophen (NORCO/VICODIN) 5-325 MG tablet Take 1-2 tablets by mouth every 6 (six) hours as needed for moderate pain. Patient taking differently: Take 1 tablet by mouth every 6 (six) hours as needed for moderate pain.  12/09/15   Lanney GinsBabish, Matthew, PA-C  levothyroxine (SYNTHROID, LEVOTHROID) 100 MCG tablet Take 100 mcg by mouth daily before breakfast. On an empty stomach    [provider]  LORazepam (ATIVAN) 1 MG tablet Take 1 mg by mouth every 6 (six) hours as needed for anxiety.    [provider]  losartan (COZAAR) 25 MG tablet Take 25 mg by mouth daily.    [provider]  polyethylene glycol (MIRALAX / GLYCOLAX) packet Take 17 g by mouth 2 (two) times daily. 12/09/15   Lanney GinsBabish, Matthew, PA-C  senna-docusate (SENOKOT-S) 8.6-50 MG tablet Take 1 tablet by mouth daily as needed for mild constipation.    [provider]    FAMILY HISTORY: Family History  Problem Relation Age of Onset  . Hypertension Son     SOCIAL HISTORY:  reports that she has never smoked. She does not have any smokeless tobacco history on file. She reports that she does not drink alcohol or use drugs.  PHYSICAL EXAM: Blood pressure (!) 102/47, pulse 79, temperature 97.6 F (36.4 C), temperature source Axillary, resp. rate 17, height 5\' 2"  (1.575 m), weight 41.2 kg, SpO2 96 %.  General appearance :Awake, and pleasantly confused-speech does appear to be slurred at times.  Chronically sick appearing.  Very elderly. HEENT: Atraumatic and Normocephalic Neck: supple, no JVD. No cervical lymphadenopathy. No thyromegaly Resp:Good air entry bilaterally, no added sounds  CVS: S1 S2 regular, 3/6 systolic murmur.  GI: Bowel sounds present, Non tender and not distended with no gaurding, rigidity or rebound. Extremities: B/L Lower Ext shows no edema, both legs are warm to touch Neurology: Appears to be moving all 4 extremities-exam is limited by mental  status. Musculoskeletal:gait appears to be normal.No digital cyanosis Skin:No Rash, warm and dry Wounds:N/A  LABS ON ADMISSION:  I have personally reviewed following labs and imaging studies  CBC: Recent Labs  Lab 10/01/18 1405 10/02/18 0510  WBC 23.5* 18.4*  NEUTROABS 20.9*  --   HGB 11.2* 10.8*  HCT 37.2 34.4*  MCV 97.4 96.1  PLT 374 340    Basic Metabolic Panel: Recent Labs  Lab 10/01/18 1405 10/02/18 0510  NA 139 142  K 4.6 4.3  CL 107 111  CO2 24 23  GLUCOSE 106* 100*  BUN 46* 43*  CREATININE 1.32* 1.20*  CALCIUM 12.6* 12.0*    GFR: Estimated Creatinine Clearance: 19.5 mL/min (A) (by C-G formula based on SCr of 1.2 mg/dL (H)).  Liver Function Tests: Recent Labs  Lab 10/01/18 1405  AST 21  ALT 16  ALKPHOS 83  BILITOT 1.6*  PROT 6.9  ALBUMIN 2.6*   No results for input(s): LIPASE, AMYLASE in the last 168 hours. Recent Labs  Lab  10/01/18 1405  AMMONIA 21    Coagulation Profile: Recent Labs  Lab 10/01/18 1405  INR 1.24    Cardiac Enzymes: No results for input(s): CKTOTAL, CKMB, CKMBINDEX, TROPONINI in the last 168 hours.  BNP (last 3 results) No results for input(s): PROBNP in the last 8760 hours.  HbA1C: Recent Labs    10/02/18 0510  HGBA1C 5.3    CBG: Recent Labs  Lab 10/01/18 1432 10/02/18 0012 10/02/18 0344 10/02/18 0827  GLUCAP 93 90 92 90    Lipid Profile: Recent Labs    10/02/18 0510  CHOL 163  HDL 25*  LDLCALC 108*  TRIG 150*  CHOLHDL 6.5    Thyroid Function Tests: Recent Labs    10/01/18 1405  TSH 9.005*    Anemia Panel: No results for input(s): VITAMINB12, FOLATE, FERRITIN, TIBC, IRON, RETICCTPCT in the last 72 hours.  Urine analysis:    Component Value Date/Time   COLORURINE AMBER (A) 10/01/2018 1859   APPEARANCEUR TURBID (A) 10/01/2018 1859   LABSPEC 1.020 10/01/2018 1859   PHURINE 8.0 10/01/2018 1859   GLUCOSEU NEGATIVE 10/01/2018 1859   HGBUR MODERATE (A) 10/01/2018 1859   BILIRUBINUR  NEGATIVE 10/01/2018 1859   KETONESUR NEGATIVE 10/01/2018 1859   PROTEINUR >=300 (A) 10/01/2018 1859   UROBILINOGEN 0.2 02/03/2010 1950   NITRITE NEGATIVE 10/01/2018 1859   LEUKOCYTESUR MODERATE (A) 10/01/2018 1859    Sepsis Labs: Lactic Acid, Venous No results found for: LATICACIDVEN   Microbiology: No results found for this or any previous visit (from the past 240 hour(s)).    RADIOLOGIC STUDIES ON ADMISSION: Dg Chest 2 View  Result Date: 10/01/2018 CLINICAL DATA:  Altered mental status EXAM: CHEST - 2 VIEW COMPARISON:  11/08/2015 FINDINGS: The heart size and mediastinal contours are within normal limits. Both lungs are clear. The visualized skeletal structures are unremarkable. IMPRESSION: No active cardiopulmonary disease. Electronically Signed   By: Deatra RobinsonKevin  Herman M.D.   On: 10/01/2018 16:01   Ct Head Wo Contrast  Result Date: 10/01/2018 CLINICAL DATA:  [age] year old female with altered level of consciousness. EXAM: CT HEAD WITHOUT CONTRAST TECHNIQUE: Contiguous axial images were obtained from the base of the skull through the vertex without intravenous contrast. COMPARISON:  None. FINDINGS: Brain: Patchy bilateral white matter hypodensity. Symmetric bilateral dystrophic basal ganglia calcifications. Asymmetric and abnormal appearance of the right superior temporal lobe and frontal operculum best demonstrated on coronal image 40. No associated hemorrhage or regional mass effect. Elsewhere gray-white matter differentiation appears more normal. No midline shift, ventriculomegaly, mass effect, evidence of mass lesion. Vascular: Calcified atherosclerosis at the skull base. Skull: No acute osseous abnormality identified. Hyperostosis, normal variant. Carious maxillary dentition. Sinuses/Orbits: Paranasal sinuses, tympanic cavities and mastoids appear clear. Other: No acute orbit or scalp soft tissue finding. Negative visible noncontrast deep soft tissue spaces of the face. IMPRESSION: 1. Abnormal  appearance of the right temporal and frontal operculum suspicious for recent infarct. Query left side symptoms. No associated hemorrhage or mass effect. Brain MRI recommended (noncontrast should suffice if the clinical picture also suggests infarct). 2. Bilateral white matter changes most commonly due to small vessel disease. 3. Carious maxillary dentition. Electronically Signed   By: Odessa FlemingH  Hall M.D.   On: 10/01/2018 15:53    I have personally reviewed images of chest xray   EKG:  Personally reviewed.  RBBB   ASSESSMENT AND PLAN: Acute encephalopathy: Per family-patient's confusion is worse than usual baseline-apart from mild AKI-really no other metabolic abnormalities.  CT head shows possible CVA-but  apart from mild intermittent dysarthria-she really does not have focal deficits.  Will obtain MRI brain-and follow clinical course.  Family aware that given her advanced dementia-she is at risk for delirium during this hospital stay.  Leukocytosis: No other foci of infection apparent-she does not appear toxic-afebrile-family does acknowledge that patient urine is very dark in color and very foul-smelling.  Will obtain UA, urine culture-blood cultures will be obtained.  Will empirically start Rocephin.  If cultures are negative-all antimicrobial therapy could be discontinued.  If she indeed does have a CVA-then leukocytosis could be reactive to that.  Abnormal CT scan of the head: Some mild dysarthria that is intermittent-really no other focal deficits-CT head with possible CVA-if so this is already at least 81 week old.  Will obtain MRI brain-if it does confirm acute/subacute CVA-we can then contemplate further work-up.  However patient is 85 years old-and is very frail and chronically sick appearing-not sure if further work-up will change management outcome in this case.   Mild AKI: Likely hemodynamically mediated-cautiously hydrate and recheck electrolytes tomorrow.  Dementia with delirium: Per  family/caregivers-patient has sundowners in the evening even at home-at baseline-she is just about able to walk with help of a walker, and needs some assistance with feeding.  She is dependent on her caregivers for significant activities of daily living.  Will continue Lexapro and as needed lorazepam.  Family aware that patient is at risk for significant delirium during this hospital stay.  Hypertension: Continue atenolol-follow.  Hypothyroidism: Continue Synthroid  Failure to thrive syndrome: Suspect has significant amount of debility and frailty at baseline.  Will obtain PT/OT/SLP evaluation.  Per patient's caregiver/family-there has been a market decline in her functional status over the past 1 week-much more than her usual baseline-previously she was able to ambulate with the help of a walker-currently she is unable.  She may benefit from subacute rehab at [facility] on discharge.  Advanced directives: Patient son at bedside-who claims he is HPOA-agrees to a DNR order.  Further plan will depend as patient's clinical course evolves and further radiologic and laboratory data become available. Patient will be monitored closely.  Above noted plan was discussed with patient's son and caregiver face to face at bedside, they were in agreement.   CONSULTS: None  DVT Prophylaxis: Prophylactic Lovenox  Code Status: DNR  Disposition Plan:  Discharge back home vs SNF possibly in 2-3 days, depending on clinical course  Admission status: Inpatient  going to tele  The medical decision making on this patient was of high complexity and the patient is at high risk for clinical deterioration, therefore this is a level 3 visit.   Total time spent  55 minutes.Greater than 50% of this time was spent in counseling, explanation of diagnosis, planning of further management, and coordination of care.  Severity of Illness: The appropriate patient status for this patient is INPATIENT. Inpatient status is judged to  be reasonable and necessary in order to provide the required intensity of service to ensure the patient's safety. The patient's presenting symptoms, physical exam findings, and initial radiographic and laboratory data in the context of their chronic comorbidities is felt to place them at high risk for further clinical deterioration. Furthermore, it is not anticipated that the patient will be medically stable for discharge from the hospital within 2 midnights of admission. The following factors support the patient status of inpatient.   " The patient's presenting symptoms include worsening confusion, intermittent dysarthria, debility. " The worrisome physical exam findings include  dysarthria. " The initial radiographic and laboratory data are worrisome because of possible CVA on CT head " The chronic co-morbidities include advanced dementia, hypertension   * I certify that at the point of admission it is my clinical judgment that the patient will require inpatient hospital care spanning beyond 2 midnights from the point of admission due to high intensity of service, high risk for further deterioration and high frequency of surveillance required.**    [facility] Pager [phone]  If 7PM-7AM, please contact night-coverage [email or web address] Password [email or web address] 10/02/2018, 1:05 PM

## 2018-10-02 NOTE — Progress Notes (Signed)
85 year old white female admitted from [facility] ED with AMS ,advanced dementia with slurred speech and unable to follow command. Pt. MAE but does not follow direction . Cardiac monitor in place will continue to monitor.

## 2018-10-02 NOTE — Progress Notes (Signed)
Unable to complete Admission data bos pt confused   does not follow command .  No family present.

## 2018-10-02 NOTE — Evaluation (Signed)
Speech Language Pathology Evaluation Patient Details Name: Natasha BjornstadJuanita Edmondson MRN: [id number] DOB: 09/29/1924 Today's Date: 10/02/2018 Time: [id number] SLP Time Calculation (min) (ACUTE ONLY): 8 min  Problem List:  Patient Active Problem List   Diagnosis Date Noted  . Encephalopathy acute 10/01/2018  . Dementia with behavioral disturbance (HCC) 10/01/2018  . S/P right hip hemi 12/07/2015  . Closed right hip fracture (HCC) 11/08/2015  . ARF (acute renal failure) (HCC) 11/08/2015  . Hypertension 11/08/2015  . HLD (hyperlipidemia) 11/08/2015  . Hypothyroidism 11/08/2015  . Hip fracture (HCC) 11/08/2015   Past Medical History:  Past Medical History:  Diagnosis Date  . Anemia   . Anxiety   . Chronic kidney disease    chronic kidney disease stage III  . Cognitive communication deficit   . Dementia (HCC)   . Depression   . Difficulty in walking   . Dysphagia    oropharyngeal phase  . Fracture of right femur (HCC)    neck of right femur   . Hx of falling   . Hypercholesteremia   . Hypertension   . Hypothyroidism   . Muscle weakness   . Nausea   . Other lack of coordination   . Presence of left artificial hip joint   . Thyroid disease    Past Surgical History:  Past Surgical History:  Procedure Laterality Date  . HIP ARTHROPLASTY Right 12/07/2015   Procedure: CONVERSION RIGHT FEMORAL NECK FRACTURE TO HEMI ARTHROPLASTY ;  Surgeon: Durene RomansMatthew Olin, MD;  Location: [facility];  Service: Orthopedics;  Laterality: Right;  . HIP PINNING,CANNULATED Right 11/09/2015   Procedure: CANNULATED HIP PINNING;  Surgeon: Durene RomansMatthew Olin, MD;  [facility];  Service: Orthopedics;  Laterality: Right;  . HIP SURGERY     HPI:  Patient presents to the emergency room for evaluation of altered mental status.  Patient has a history of dementia and she lives with family members at home. Abnormal appearance of the right temporal and frontal operculum suspicious for recent infarct. Query left side symptoms.  Bilateral white matter changes most commonly due to small vessel disease. MRI pending.    Assessment / Plan / Recommendation Clinical Impression  Pt presents with significant cognitive impairments. Currently she is disoriented x 4, unable to follow directions, decreased alertness and attention to task with intermittent perseverative repetition of phrases spoken by SLP.  When provided with oral suctioning, pt able to push uction away with her hand and state "I don't like that." However, this was the extent of pt's ability to communicate throughout evaluation. Family was not present to establish baseline ability. ST to continue to follow acutely for improved mentation.     SLP Assessment  SLP Recommendation/Assessment: Patient needs continued Speech Lanaguage Pathology Services SLP Visit Diagnosis: Cognitive communication deficit (R41.841)    Follow Up Recommendations  Skilled Nursing facility    Frequency and Duration min 2x/week  2 weeks      SLP Evaluation Cognition  Overall Cognitive Status: Difficult to assess(all areas of cognitive function are severely impaired) Arousal/Alertness: (varying levels of alertness throughout session) Orientation Level: Disoriented X4       Comprehension  Auditory Comprehension Overall Auditory Comprehension: Impaired at baseline(unknown level of baseline impairment) Visual Recognition/Discrimination Discrimination: Not tested Reading Comprehension Reading Status: Not tested    Expression Expression Primary Mode of Expression: Verbal Verbal Expression Overall Verbal Expression: Impaired(unknown level of baseline impairment)   Oral / Motor  Oral Motor/Sensory Function Overall Oral Motor/Sensory Function: (no focal deficits observed) Motor Speech Overall  Motor Speech: Impaired(likely related to cognitive impairments)   GO                    Cyprian Gongaware 10/02/2018, 10:56 AM

## 2018-10-03 DIAGNOSIS — R410 Disorientation, unspecified: Secondary | ICD-10-CM

## 2018-10-03 LAB — CBC
HEMATOCRIT: 31 % — AB (ref 36.0–46.0)
Hemoglobin: 9.4 g/dL — ABNORMAL LOW (ref 12.0–15.0)
MCH: 29.8 pg (ref 26.0–34.0)
MCHC: 30.3 g/dL (ref 30.0–36.0)
MCV: 98.4 fL (ref 80.0–100.0)
Platelets: 266 10*3/uL (ref 150–400)
RBC: 3.15 MIL/uL — ABNORMAL LOW (ref 3.87–5.11)
RDW: 13.9 % (ref 11.5–15.5)
WBC: 12.6 10*3/uL — ABNORMAL HIGH (ref 4.0–10.5)
nRBC: 0 % (ref 0.0–0.2)

## 2018-10-03 LAB — GLUCOSE, CAPILLARY: Glucose-Capillary: 85 mg/dL (ref 70–99)

## 2018-10-03 LAB — BASIC METABOLIC PANEL
Anion gap: 6 (ref 5–15)
BUN: 35 mg/dL — ABNORMAL HIGH (ref 8–23)
CO2: 22 mmol/L (ref 22–32)
Calcium: 11 mg/dL — ABNORMAL HIGH (ref 8.9–10.3)
Chloride: 118 mmol/L — ABNORMAL HIGH (ref 98–111)
Creatinine, Ser: 0.97 mg/dL (ref 0.44–1.00)
GFR calc Af Amer: 59 mL/min — ABNORMAL LOW (ref >60)
GFR calc non Af Amer: 51 mL/min — ABNORMAL LOW (ref >60)
Glucose, Bld: 96 mg/dL (ref 70–99)
Potassium: 3.6 mmol/L (ref 3.5–5.1)
Sodium: 146 mmol/L — ABNORMAL HIGH (ref 135–145)

## 2018-10-03 LAB — URINE CULTURE

## 2018-10-03 MED ORDER — KCL IN DEXTROSE-NACL 10-5-0.45 MEQ/L-%-% IV SOLN
INTRAVENOUS | Status: DC
  Administered 2018-10-03 – 2018-10-04 (×2): via INTRAVENOUS
  Filled 2018-10-03 (×2): qty 1000

## 2018-10-03 MED ORDER — ATENOLOL 25 MG PO TABS
50.0000 mg | ORAL_TABLET | Freq: Every day | ORAL | Status: DC
  Administered 2018-10-04 – 2018-10-06 (×2): 50 mg via ORAL
  Filled 2018-10-03 (×3): qty 2

## 2018-10-03 MED ORDER — LORAZEPAM 2 MG/ML IJ SOLN: 0.5000 mg | Freq: Once | INTRAMUSCULAR | Status: DC | PRN

## 2018-10-03 MED ORDER — SODIUM CHLORIDE 0.9 % IV SOLN: INTRAVENOUS | Status: DC

## 2018-10-03 MED ORDER — METOPROLOL TARTRATE 5 MG/5ML IV SOLN
2.5000 mg | Freq: Two times a day (BID) | INTRAVENOUS | Status: DC
  Administered 2018-10-03: 2.5 mg via INTRAVENOUS
  Filled 2018-10-03: qty 5

## 2018-10-03 MED ORDER — LEVOTHYROXINE SODIUM 100 MCG/5ML IV SOLN
50.0000 ug | Freq: Every day | INTRAVENOUS | Status: DC
  Administered 2018-10-03: 50 ug via INTRAVENOUS
  Filled 2018-10-03: qty 5

## 2018-10-03 MED ORDER — LEVOTHYROXINE SODIUM 100 MCG PO TABS
100.0000 ug | ORAL_TABLET | Freq: Every day | ORAL | Status: DC
  Administered 2018-10-04 – 2018-10-08 (×5): 100 ug via ORAL
  Filled 2018-10-03 (×6): qty 1

## 2018-10-03 NOTE — Progress Notes (Signed)
PROGRESS NOTE    Natasha Beasley  WUJ:[id number] DOB: 1925-04-10 DOA: 10/01/2018 PCP: Hali Marry, MD   Brief Narrative:  Natasha Beasley is a 85 y.o. year old female with medical history significant for advanced dementia (lives at home with 24/7 care by caregivers), HTN, HLD, hypothyroidism and other comorbidites who presented on 10/01/2018 with unsteady gait, slurred speech, diminished appetite for the past 1 to 2 weeks and was found to have abnormal CT head imaging, hypercalcemia, AKI, presumed UTI on Rocephin. Patient currently awaiting MRI brain for further characterization and currently being rehydrated.  Assessment & Plan:   Principal Problem:   Encephalopathy acute Active Problems:   Hypertension   HLD (hyperlipidemia)   Hypothyroidism   Dementia with behavioral disturbance (HCC)   Hypercalcemia   AKI (acute kidney injury) (HCC)   Confusion  Acute encephalopathy in patient with known dementia. -Family reports new onset weakness, slurred speech over the past week different from her typical baseline of being able to ambulate and communicate though she does have known dementia with episodes of confusion.   -Differential includes stroke though no focal deficits on exam (no abnormalities found on CT head mention below),  infection (UTI), hypercalcemia in setting of dehydration, elevated TSH.   -Encouragingly glucose, ammonia are within normal limits. -Continue neurochecks.   -See Below  Abnormal appearance of right temporal frontal area on CT head.   -Concern for infarct especially in setting of new symptoms and decline from baseline over the past week.   -Family still wants to proceed with MRI brain and potential DAPT therapy if needed.   -Due to altered mental status patient unable to remain still, will need to use medication prior; MRI was attempted last night but will be attempted again today  -Will use IV Lorazepam 0.5 mg IVprn for Sedation -Will need PT/OT to evaluate  and treat    Hypercalcemia -Mild.  Calcium elevated at 12 in the setting of diminished p.o. intake related to changes in mental status -Improving and Ca2+ is now 11.0 -C/w IVF as below   Abnormal UA, concern for UTI.   -Patient family does report malodorous urine and history of chronic UTIs.   -Alysis showed turbid appearance with amber color urine, moderate hemoglobin, moderate leukocytes, greater than 300 protein, many bacteria, 21-50 RBCs per high-power field, and 21-50 WBCs -Empirically on IV Rocephin and will continue -Culture showed multiple bacterial morphotypes present with none predominant we will repeat urine culture  Leukocytosis, improving.   -Peak of 23.5 proven down to 12.6 today -Likely stress leukocytosis but could also be related to potential UTI.   -Blood cultures showed NGTD at 1 Day -We will continue to closely monitor for S/Sx of Infection -Repeat CBC in AM   AKI, slowly improving.   -Most recent baseline creatinine within normal limits.  On admission creatinine 1.32, was 1.2 yesterday.   -Likely prerenal etiology related to dehydration -BUN/Cr is now 35/0.97 -Continue IV fluids but changed to D5 1/NS at 75 mL/hr given mild HyperNatremia and HyperChloremia  -Avoid Nephrotoxic Medications and Contrast dyes if possible -Continue Monitor and Trend Renal Fxn -Repeat CMP in AM  Hypothyroidism  -TSH elevated at 9, unclear if patient was adherent with Synthroid,  -Will continue 100 mcg Synthroid po Daily -Was changed to IV Synthroid 50 mcg today but changed back as she is now on a Dysphagia 1 Diet with Nectar Thick Liquids   Hypertension -Stable -Continue home Atenolol 50 mg po Daily   Failure to Thrive -Very  thin female and frail.   -Family reports only 3 days of diminished oral intake but suspect this is been going on for longer.   -Per speech evaluation patient to remain n.p.o. is unsafe to tolerate oral diet but has now been advanced to Dysphagia 1  Diet with Nectar Thick Liquids -Family aware and would like to see if any of her symptoms are reversible in the next 24 to 48 hours before considering potential comfort care measures but holding off for now  Normocytic Anemia -Patient's Hb/Hct went from 10.8/34.4 -> 9.4/31.0 -C/w Ferrous Sulfate 325 mg po TID -Continue to Monitor for S/Sx of Bleeding -Repeat CBC in AM   Underweight -Estimated body mass index is 16.61 kg/m as calculated from the following:   Height as of this encounter: 5\' 2"  (1.575 m).   Weight as of this encounter: 41.2 kg. -Nutritionist is following and recommending advancing Diet as Appropriate  HyperNatremia/HyperChloremia -Na+ is mild at 146 and Chloride is 118 -Changed IVF as above -Continue to Monitor and Trend -Repeat CMP in AM   DVT prophylaxis: Enoxaparin 20 mg sq q24h Code Status: DO NOT RESUSCITATE  Family Communication: Discussed with Son at Bedside Disposition Plan: Pending further workup and evaluation by PT  Consultants:   None   Procedures:  None   Antimicrobials:  Anti-infectives (From admission, onward)   Start     Dose/Rate Route Frequency Ordered Stop   10/02/18 0000  cefTRIAXone (ROCEPHIN) 1 g in sodium chloride 0.9 % 100 mL IVPB     1 g 200 mL/hr over 30 Minutes Intravenous Every 24 hours 10/01/18 2321       Subjective: The patient is a thin Caucasian female who was seen and examined at bedside currently in no acute distress is awake alert still somewhat confused.  Son at bedside and has some questions.  Patient is very hard of hearing but denies any complaints.  Still waiting for MRI to be done.  No nausea or vomiting.  No other concerns or complaints at this time  Objective: Vitals:   10/03/18 0434 10/03/18 0738 10/03/18 1212 10/03/18 1624  BP: (!) 130/58 (!) 140/54 128/60 132/70  Pulse: 72 75 73 78  Resp:  18 18 18   Temp: 97.6 F (36.4 C) (!) 97.5 F (36.4 C)  (!) 97.4 F (36.3 C)  TempSrc: Oral Oral  Oral  SpO2:  97% 98% 99% 98%  Weight:      Height:        Intake/Output Summary (Last 24 hours) at 10/03/2018 1711 Last data filed at 10/03/2018 1600 Gross per 24 hour  Intake 1559.53 ml  Output -  Net 1559.53 ml   Filed Weights   10/01/18 2030  Weight: 41.2 kg   Examination: Physical Exam:  Constitutional: Thin elderly frail Caucasian female in NAD and appears calm and comfortable Eyes: Lids and conjunctivae normal, sclerae anicteric  ENMT: External Ears, Nose appear normal. Hard of  hearing.  Neck: Appears normal, supple, no cervical masses, normal ROM, no appreciable thyromegaly; no JVD Respiratory: Diminished to auscultation bilaterally, no wheezing, rales, rhonchi or crackles. Normal respiratory effort and patient is not tachypenic. No accessory muscle use.  Cardiovascular: RRR, no murmurs / rubs / gallops. S1 and S2 auscultated. No extremity edema.  Abdomen: Soft, non-tender, non-distended. No masses palpated. No appreciable hepatosplenomegaly. Bowel sounds positive x4.  GU: Deferred. Musculoskeletal: No clubbing / cyanosis of digits/nails. No joint deformity upper and lower extremities.  Skin: No rashes, lesions, ulcers on a limited  skin evaluation. No induration; Warm and dry.  Neurologic: CN 2-12 grossly intact with no focal deficits but is extremely hard of hearing. Romberg sign and cerebellar reflexes not assessed.  Psychiatric: Impaired judgment and insight. Alert and awake but not oriented. Normal mood and appropriate affect.   Data Reviewed: I have personally reviewed following labs and imaging studies  CBC: Recent Labs  Lab 10/01/18 1405 10/02/18 0510 10/03/18 0459  WBC 23.5* 18.4* 12.6*  NEUTROABS 20.9*  --   --   HGB 11.2* 10.8* 9.4*  HCT 37.2 34.4* 31.0*  MCV 97.4 96.1 98.4  PLT 374 340 266   Basic Metabolic Panel: Recent Labs  Lab 10/01/18 1405 10/02/18 0510 10/03/18 0459  NA 139 142 146*  K 4.6 4.3 3.6  CL 107 111 118*  CO2 24 23 22   GLUCOSE 106* 100* 96    BUN 46* 43* 35*  CREATININE 1.32* 1.20* 0.97  CALCIUM 12.6* 12.0* 11.0*   GFR: Estimated Creatinine Clearance: 24.1 mL/min (by C-G formula based on SCr of 0.97 mg/dL). Liver Function Tests: Recent Labs  Lab 10/01/18 1405  AST 21  ALT 16  ALKPHOS 83  BILITOT 1.6*  PROT 6.9  ALBUMIN 2.6*   No results for input(s): LIPASE, AMYLASE in the last 168 hours. Recent Labs  Lab 10/01/18 1405  AMMONIA 21   Coagulation Profile: Recent Labs  Lab 10/01/18 1405  INR 1.24   Cardiac Enzymes: No results for input(s): CKTOTAL, CKMB, CKMBINDEX, TROPONINI in the last 168 hours. BNP (last 3 results) No results for input(s): PROBNP in the last 8760 hours. HbA1C: Recent Labs    10/02/18 0510  HGBA1C 5.3   CBG: Recent Labs  Lab 10/01/18 1432 10/02/18 0012 10/02/18 0344 10/02/18 0827 10/03/18 0622  GLUCAP 93 90 92 90 85   Lipid Profile: Recent Labs    10/02/18 0510  CHOL 163  HDL 25*  LDLCALC 108*  TRIG 150*  CHOLHDL 6.5   Thyroid Function Tests: Recent Labs    10/01/18 1405  TSH 9.005*   Anemia Panel: No results for input(s): VITAMINB12, FOLATE, FERRITIN, TIBC, IRON, RETICCTPCT in the last 72 hours. Sepsis Labs: No results for input(s): PROCALCITON, LATICACIDVEN in the last 168 hours.  Recent Results (from the past 240 hour(s))  Culture, blood (routine x 2)     Status: None (Preliminary result)   Collection Time: 10/02/18  5:10 AM  Result Value Ref Range Status   Specimen Description BLOOD RIGHT ANTECUBITAL  Final   Special Requests   Final    BOTTLES DRAWN AEROBIC AND ANAEROBIC Blood Culture adequate volume   Culture   Final    NO GROWTH 1 DAY Performed at [facility], [address], [place], [place] [address]    Report Status PENDING  Incomplete  Culture, blood (routine x 2)     Status: None (Preliminary result)   Collection Time: 10/02/18  5:18 AM  Result Value Ref Range Status   Specimen Description BLOOD RIGHT HAND  Final   Special Requests    Final    BOTTLES DRAWN AEROBIC AND ANAEROBIC Blood Culture adequate volume   Culture   Final    NO GROWTH 1 DAY Performed at [facility], [address], [place], [place] [address]    Report Status PENDING  Incomplete  Culture, Urine     Status: None   Collection Time: 10/02/18  6:30 AM  Result Value Ref Range Status   Specimen Description URINE, CATHETERIZED  Final   Special Requests   Final    NONE Performed at [facility], [address] [place], [place] [address]    Culture   Final    Multiple bacterial morphotypes present, none predominant. Suggest appropriate recollection if clinically indicated.   Report Status 10/03/2018 FINAL  Final    Radiology Studies: No results found.  Scheduled Meds: . cholecalciferol  1,000 Units Oral Daily  . enoxaparin (LOVENOX) injection  20 mg Subcutaneous Q24H  . escitalopram  10 mg Oral Daily  . ferrous sulfate  325 mg Oral TID PC  . levothyroxine  50 mcg Intravenous Daily  . metoprolol tartrate  2.5 mg Intravenous Q12H   Continuous Infusions: . cefTRIAXone (ROCEPHIN)  IV 1 g (10/03/18 0005)  . dextrose 5 % and 0.45 % NaCl with KCl 10 mEq/L 75 mL/hr at 10/03/18 1116    LOS: 2 days   Merlene Laughtermair Latif Myriam Brandhorst, DO [facility] PAGER is on AMION  If 7PM-7AM, please contact night-coverage [email or web address] Password [email or web address] 10/03/2018, 5:11 PM

## 2018-10-03 NOTE — Progress Notes (Signed)
Initial Nutrition Assessment  DOCUMENTATION CODES:   Underweight  INTERVENTION:  Diet advancement as appropriate.   RD to continue to monitor.   NUTRITION DIAGNOSIS:   Inadequate oral intake related to inability to eat as evidenced by NPO status.  GOAL:   Patient will meet greater than or equal to 90% of their needs  MONITOR:   PO intake, Labs, Diet advancement, Weight trends, I & O's  REASON FOR ASSESSMENT:   Malnutrition Screening Tool    ASSESSMENT:   85 y.o. year old female with medical history significant for advanced dementia (lives at home with 24/7 care by caregivers), HTN, HLD, hypothyroidism who presented on 10/01/2018 with unsteady gait, slurred speech, diminished appetite for the past 1 to 2 weeks and was found to have abnormal CT head imaging, hypercalcemia, AKI, presumed UTI.  No family at bedside during time of visit. Pt unable to respond to RD interview appropriately. RD unable to obtain most recent pt nutrition history. Per MD note, family desires to wait 24-40 hours if pt symptoms improve before considering comfort care measure. Unable to complete Nutrition-Focused physical exam at this time. Pt refusing physical exam stating " you're not seeing them" referring to her extremities. Per SLP, pt to remain NPO until more alert to eat. RD to continue to monitor and order nutritional supplements when diet advances as appropriate.   Labs and medications reviewed.   Diet Order:   Diet Order            Diet NPO time specified  Diet effective now              EDUCATION NEEDS:   Not appropriate for education at this time  Skin:  Skin Assessment: Reviewed RN Assessment  Last BM:  1/13  Height:   Ht Readings from Last 1 Encounters:  10/01/18 5\' 2"  (1.575 m)    Weight:   Wt Readings from Last 1 Encounters:  10/01/18 41.2 kg    Ideal Body Weight:  50 kg  BMI:  Body mass index is 16.61 kg/m.  Estimated Nutritional Needs:   Kcal:   1350-1550  Protein:  55-65 grams  Fluid:  >/= 1.5 L/day    Roslyn Smiling, MS, RD, LDN Pager # [phone] After hours/ weekend pager # [phone]

## 2018-10-03 NOTE — Progress Notes (Signed)
  Speech Language Pathology Treatment: Dysphagia  Patient Details Name: Natasha Beasley MRN: [id number] DOB: [birth year] Today's Date: 10/03/2018 Time: 0165-5374 SLP Time Calculation (min) (ACUTE ONLY): 13 min  Assessment / Plan / Recommendation Clinical Impression  Pt seen for trials of PO, still significantly confused and inattentive to PO, requiring tactile cues of PO to lips to recognize and accept bolus. Oral response is intermittently swift and appearing adequate though at times there is prolonged oral manipulation or even appearance of possible spill to pharynx without control or awareness, particularly with thin liquids. In general, puree is tolerated well and nectar thick liquids are expected to be tolerated if accepted. Pt will be at risk of inadequate nutrition if cognitive is not expected to improve. Will initiate a puree/nectar thick diet and monitor for tolerance.   HPI HPI: Patient presents to the emergency room for evaluation of altered mental status.  Patient has a history of dementia and she lives with family members at home. Abnormal appearance of the right temporal and frontal operculum suspicious for recent infarct. Query left side symptoms. Bilateral white matter changes most commonly due to small vessel disease. MRI pending.       SLP Plan  Continue with current plan of care       Recommendations  Diet recommendations: Dysphagia 1 (puree);Nectar-thick liquid Liquids provided via: Cup;Teaspoon Medication Administration: Crushed with puree Supervision: Full supervision/cueing for compensatory strategies Compensations: Slow rate;Small sips/bites Postural Changes and/or Swallow Maneuvers: Seated upright 90 degrees                Oral Care Recommendations: Oral care QID Follow up Recommendations: Skilled Nursing facility SLP Visit Diagnosis: Cognitive communication deficit (M27.078) Plan: Continue with current plan of care       GO               Harlon Ditty, MA CCC-SLP  Acute Rehabilitation Services Pager [phone] Office [phone]  Claudine Mouton 10/03/2018, 4:22 PM

## 2018-10-03 NOTE — Evaluation (Signed)
Occupational Therapy Evaluation Patient Details Name: Carlena BjornstadJuanita Korpela MRN: [id number] DOB: 03/13/1925 Today's Date: 10/03/2018    History of Present Illness Patient is a 85 y/o female who presents with AMS and slurred speech. Head CT-Abnormal appearance of the right temporal and frontal operculum suspicious for recent infart. PMH includes dementia, HTN, CKD, left THA, right hemiarthroplasty.   Clinical Impression   Pt presents to OT with significant cognitive deficits that impact her ability to participate functionally in ADLs or functional mobility. Pt is not currently following directions and is resistive to functional mobility. Recommend brief OT follow acutely to determine appropriateness for intervention. If family is willing to provide dependent care at home, recommend HHOT to follow up, if not recommend SNF.     Follow Up Recommendations  Supervision/Assistance - 24 hour;SNF    Equipment Recommendations  None recommended by OT    Recommendations for Other Services Speech consult;PT consult     Precautions / Restrictions Precautions Precautions: Fall Restrictions Weight Bearing Restrictions: No      Mobility Bed Mobility Overal bed mobility: Needs Assistance Bed Mobility: Supine to Sit;Sit to Supine     Supine to sit: Total assist Sit to supine: Total assist      Transfers                 General transfer comment: Deferred as pt not participating or following directions    Balance Overall balance assessment: Needs assistance Sitting-balance support: Feet supported Sitting balance-Leahy Scale: Zero                                     ADL either performed or assessed with clinical judgement   ADL Overall ADL's : Needs assistance/impaired Eating/Feeding: NPO   Grooming: Total assistance;Bed level   Upper Body Bathing: Total assistance;Bed level   Lower Body Bathing: Total assistance;Bed level   Upper Body Dressing : Total  assistance;Bed level   Lower Body Dressing: Total assistance;Bed level                 General ADL Comments: Did not progress any mobility past bed level     Vision Patient Visual Report: (pt unable to state, difficult to assess. Pt able to track OT)              Pertinent Vitals/Pain Pain Assessment: Faces Pain Score: 0-No pain     Hand Dominance     Extremity/Trunk Assessment Upper Extremity Assessment Upper Extremity Assessment: Difficult to assess due to impaired cognition   Lower Extremity Assessment Lower Extremity Assessment: Defer to PT evaluation   Cervical / Trunk Assessment Cervical / Trunk Assessment: Kyphotic   Communication Communication Communication: Receptive difficulties;Expressive difficulties   Cognition Arousal/Alertness: Lethargic Behavior During Therapy: Flat affect Overall Cognitive Status: Difficult to assess                                 General Comments: not following any commands. Not able to understand any speech. per baseline in chart, pt can hold a conversation.   General Comments  Pt smiling throughout session but unable to follow any commands functionally            Home Living Family/patient expects to be discharged to:: Private residence Living Arrangements: Other relatives;Non-relatives/Friends Available Help at Discharge: Available 24 hours/day  Home Equipment: Walker - 2 wheels   Additional Comments: Pt not able to provide information secondary to advanced dementia and speech difficulties.   Lives With: Family    Prior Functioning/Environment Level of Independence: Needs assistance    ADL's / Homemaking Assistance Needed: Unclear how much help pt was receiving, however had 24/7 assistance   Comments: Per chart, pt has 24/7 caregivers at home and uses RW for ambulation.        OT Problem List: Decreased strength;Decreased range of motion;Decreased activity  tolerance;Impaired balance (sitting and/or standing);Decreased cognition;Decreased coordination      OT Treatment/Interventions: Self-care/ADL training;Therapeutic exercise;Balance training;Patient/family education;Cognitive remediation/compensation;Therapeutic activities;Splinting;Energy conservation;DME and/or AE instruction    OT Goals(Current goals can be found in the care plan section) Acute Rehab OT Goals Patient Stated Goal: none stated as pt unable OT Goal Formulation: Patient unable to participate in goal setting Time For Goal Achievement: 10/13/18 Potential to Achieve Goals: Poor  OT Frequency: Min 2X/week    AM-PAC OT "6 Clicks" Daily Activity     Outcome Measure Help from another person eating meals?: Total Help from another person taking care of personal grooming?: Total Help from another person toileting, which includes using toliet, bedpan, or urinal?: Total Help from another person bathing (including washing, rinsing, drying)?: Total Help from another person to put on and taking off regular upper body clothing?: Total Help from another person to put on and taking off regular lower body clothing?: Total 6 Click Score: 6   End of Session Nurse Communication: Mobility status  Activity Tolerance: Patient limited by lethargy Patient left: in bed;with call bell/phone within reach;with bed alarm set  OT Visit Diagnosis: Muscle weakness (generalized) (M62.81);Cognitive communication deficit (R41.841);Other symptoms and signs involving cognitive function;Adult, failure to thrive (R62.7)                Time: 8250-0370 OT Time Calculation (min): 17 min Charges:  OT General Charges $OT Visit: 1 Visit OT Evaluation $OT Eval Moderate Complexity: 1 Mod   Crissie Reese OTR/L  10/03/2018, 9:04 AM

## 2018-10-04 LAB — COMPREHENSIVE METABOLIC PANEL
ALT: 18 U/L (ref 0–44)
AST: 26 U/L (ref 15–41)
Albumin: 2.1 g/dL — ABNORMAL LOW (ref 3.5–5.0)
Alkaline Phosphatase: 80 U/L (ref 38–126)
Anion gap: 7 (ref 5–15)
BUN: 24 mg/dL — AB (ref 8–23)
CO2: 23 mmol/L (ref 22–32)
Calcium: 10.7 mg/dL — ABNORMAL HIGH (ref 8.9–10.3)
Chloride: 116 mmol/L — ABNORMAL HIGH (ref 98–111)
Creatinine, Ser: 0.94 mg/dL (ref 0.44–1.00)
GFR calc Af Amer: >60 mL/min (ref >60)
GFR calc non Af Amer: 53 mL/min — ABNORMAL LOW (ref >60)
Glucose, Bld: 126 mg/dL — ABNORMAL HIGH (ref 70–99)
Potassium: 3.3 mmol/L — ABNORMAL LOW (ref 3.5–5.1)
Sodium: 146 mmol/L — ABNORMAL HIGH (ref 135–145)
Total Bilirubin: 1 mg/dL (ref 0.3–1.2)
Total Protein: 5.7 g/dL — ABNORMAL LOW (ref 6.5–8.1)

## 2018-10-04 LAB — CBC WITH DIFFERENTIAL/PLATELET
ABS IMMATURE GRANULOCYTES: 0.11 10*3/uL — AB (ref 0.00–0.07)
Basophils Absolute: 0 10*3/uL (ref 0.0–0.1)
Basophils Relative: 0 %
Eosinophils Absolute: 0.2 10*3/uL (ref 0.0–0.5)
Eosinophils Relative: 1 %
HCT: 33 % — ABNORMAL LOW (ref 36.0–46.0)
Hemoglobin: 10.2 g/dL — ABNORMAL LOW (ref 12.0–15.0)
IMMATURE GRANULOCYTES: 1 %
LYMPHS ABS: 1 10*3/uL (ref 0.7–4.0)
Lymphocytes Relative: 8 %
MCH: 30.2 pg (ref 26.0–34.0)
MCHC: 30.9 g/dL (ref 30.0–36.0)
MCV: 97.6 fL (ref 80.0–100.0)
Monocytes Absolute: 0.7 10*3/uL (ref 0.1–1.0)
Monocytes Relative: 6 %
NEUTROS PCT: 84 %
Neutro Abs: 10.2 10*3/uL — ABNORMAL HIGH (ref 1.7–7.7)
Platelets: 287 10*3/uL (ref 150–400)
RBC: 3.38 MIL/uL — ABNORMAL LOW (ref 3.87–5.11)
RDW: 13.9 % (ref 11.5–15.5)
WBC: 12.3 10*3/uL — ABNORMAL HIGH (ref 4.0–10.5)
nRBC: 0 % (ref 0.0–0.2)

## 2018-10-04 LAB — RETICULOCYTES
Immature Retic Fract: 12.6 % (ref 2.3–15.9)
RBC.: 3.38 MIL/uL — ABNORMAL LOW (ref 3.87–5.11)
Retic Count, Absolute: 72.3 10*3/uL (ref 19.0–186.0)
Retic Ct Pct: 2.1 % (ref 0.4–3.1)

## 2018-10-04 LAB — PHOSPHORUS: Phosphorus: 1.5 mg/dL — ABNORMAL LOW (ref 2.5–4.6)

## 2018-10-04 LAB — VITAMIN B12: Vitamin B-12: 118 pg/mL — ABNORMAL LOW (ref 180–914)

## 2018-10-04 LAB — IRON AND TIBC
Iron: 20 ug/dL — ABNORMAL LOW (ref 28–170)
Saturation Ratios: 15 % (ref 10.4–31.8)
TIBC: 132 ug/dL — AB (ref 250–450)
UIBC: 112 ug/dL

## 2018-10-04 LAB — FERRITIN: Ferritin: 487 ng/mL — ABNORMAL HIGH (ref 11–307)

## 2018-10-04 LAB — MAGNESIUM: Magnesium: 2.1 mg/dL (ref 1.7–2.4)

## 2018-10-04 LAB — FOLATE: Folate: 9.5 ng/mL (ref >5.9)

## 2018-10-04 MED ORDER — POTASSIUM CL IN DEXTROSE 5% 20 MEQ/L IV SOLN
20.0000 meq | INTRAVENOUS | Status: DC
  Administered 2018-10-04 – 2018-10-05 (×2): 20 meq via INTRAVENOUS
  Filled 2018-10-04 (×3): qty 1000

## 2018-10-04 MED ORDER — POTASSIUM PHOSPHATES 15 MMOLE/5ML IV SOLN
30.0000 mmol | Freq: Once | INTRAVENOUS | Status: AC
  Administered 2018-10-04: 30 mmol via INTRAVENOUS
  Filled 2018-10-04: qty 10

## 2018-10-04 MED ORDER — POTASSIUM CHLORIDE 10 MEQ/100ML IV SOLN
10.0000 meq | INTRAVENOUS | Status: AC
  Administered 2018-10-04 (×2): 10 meq via INTRAVENOUS
  Filled 2018-10-04 (×2): qty 100

## 2018-10-04 MED ORDER — LORAZEPAM 2 MG/ML IJ SOLN
1.0000 mg | Freq: Once | INTRAMUSCULAR | Status: AC | PRN
  Administered 2018-10-05: 1 mg via INTRAVENOUS
  Filled 2018-10-04: qty 1

## 2018-10-04 NOTE — Progress Notes (Signed)
Physical Therapy Treatment Patient Details Name: Natasha Beasley MRN: [id number] DOB: 1925/06/22 Today's Date: 10/04/2018    History of Present Illness Patient is a 85 y/o female who presents with AMS and slurred speech. Head CT-Abnormal appearance of the right temporal and frontal operculum suspicious for recent infart. PMH includes dementia, HTN, CKD, left THA, right hemiarthroplasty.    PT Comments    Pt very limited this session secondary to fatigue and cognitive deficits. She continues to require total A x2 for bed mobility and transfers. Pt unable to follow any commands throughout. Pt would continue to benefit from skilled physical therapy services at this time while admitted and after d/c to address the below listed limitations in order to improve overall safety and independence with functional mobility.    Follow Up Recommendations  SNF;Supervision for mobility/OOB     Equipment Recommendations  None recommended by PT    Recommendations for Other Services       Precautions / Restrictions Precautions Precautions: Fall Restrictions Weight Bearing Restrictions: No    Mobility  Bed Mobility Overal bed mobility: Needs Assistance Bed Mobility: Supine to Sit;Sit to Supine     Supine to sit: Total assist;+2 for physical assistance Sit to supine: Total assist;+2 for physical assistance   General bed mobility comments: total A for all aspects with use of bed pads to position pt's hips at EOB  Transfers Overall transfer level: Needs assistance Equipment used: 2 person hand held assist Transfers: Sit to/from [employer] Sit to Stand: Total assist;+2 physical assistance Stand pivot transfers: Total assist;+2 physical assistance       General transfer comment: total A for all aspects, pt unable to assist; she was able to tolerate weight through bilateral LEs  Ambulation/Gait                 Stairs             Wheelchair Mobility     Modified Rankin (Stroke Patients Only)       Balance Overall balance assessment: Needs assistance Sitting-balance support: Feet supported Sitting balance-Leahy Scale: Zero   Postural control: Posterior lean Standing balance support: During functional activity Standing balance-Leahy Scale: Zero                              Cognition Arousal/Alertness: Awake/alert Behavior During Therapy: Flat affect Overall Cognitive Status: History of cognitive impairments - at baseline                                 General Comments: not following any commands. Not able to understand any speech. per baseline in chart, pt can hold a conversation.      Exercises      General Comments        Pertinent Vitals/Pain Pain Assessment: No/denies pain Pain Score: 0-No pain Faces Pain Scale: No hurt    Home Living                      Prior Function            PT Goals (current goals can now be found in the care plan section) Acute Rehab PT Goals PT Goal Formulation: Patient unable to participate in goal setting Time For Goal Achievement: 10/16/18 Potential to Achieve Goals: Fair Progress towards PT goals: Progressing toward goals    Frequency  Min 2X/week      PT Plan Current plan remains appropriate    Co-evaluation              AM-PAC PT "6 Clicks" Mobility   Outcome Measure  Help needed turning from your back to your side while in a flat bed without using bedrails?: Total Help needed moving from lying on your back to sitting on the side of a flat bed without using bedrails?: Total Help needed moving to and from a bed to a chair (including a wheelchair)?: Total Help needed standing up from a chair using your arms (e.g., wheelchair or bedside chair)?: Total Help needed to walk in hospital room?: Total Help needed climbing 3-5 steps with a railing? : Total 6 Click Score: 6    End of Session Equipment Utilized During  Treatment: Gait belt Activity Tolerance: Patient limited by fatigue Patient left: in chair;with call bell/phone within reach;with chair alarm set;with family/visitor present Nurse Communication: Mobility status PT Visit Diagnosis: Muscle weakness (generalized) (M62.81);Difficulty in walking, not elsewhere classified (R26.2)     Time: 4287-6811 PT Time Calculation (min) (ACUTE ONLY): 18 min  Charges:  $Therapeutic Activity: 8-22 mins                     Deborah Chalk, [place], DPT  Acute Rehabilitation Services Pager [phone] Office [phone]     Natasha Beasley 10/04/2018, 12:11 PM

## 2018-10-04 NOTE — Progress Notes (Signed)
PROGRESS NOTE    Natasha Beasley  NGE:[id number] DOB: 08/25/1925 DOA: 10/01/2018 PCP: Hali Marry, MD   Brief Narrative:  Natasha Beasley is a 85 y.o. year old female with medical history significant for advanced dementia (lives at home with 24/7 care by caregivers), HTN, HLD, hypothyroidism and other comorbidites who presented on 10/01/2018 with unsteady gait, slurred speech, diminished appetite for the past 1 to 2 weeks and was found to have abnormal CT head imaging, hypercalcemia, AKI, presumed UTI on Rocephin. Patient currently awaiting MRI brain for further characterization and currently being rehydrated but IVF changed to just D5W now.   Assessment & Plan:   Principal Problem:   Encephalopathy acute Active Problems:   Hypertension   HLD (hyperlipidemia)   Hypothyroidism   Dementia with behavioral disturbance (HCC)   Hypercalcemia   AKI (acute kidney injury) (HCC)   Confusion  Acute encephalopathy in patient with known dementia. Improving  -Family reported new onset weakness, slurred speech over the past week different from her typical baseline of being able to ambulate and communicate though she does have known dementia with episodes of confusion.   -Differential includes stroke though no focal deficits on exam (no abnormalities found on CT head mention below),  infection (UTI), hypercalcemia in setting of dehydration, elevated TSH.   -Encouragingly glucose, ammonia are within normal limits. -Continue neurochecks.   -See Below  Abnormal appearance of right temporal frontal area on CT head.   -Concern for infarct especially in setting of new symptoms and decline from baseline over the past week.   -Family still wants to proceed with MRI brain and potential DAPT therapy if needed.   -Due to altered mental status patient unable to remain still, will need to use medication prior; MRI was attempted the night before but will be attempted again today  -Will use IV Lorazepam 1  mg IVprn for Sedation for MRI -Will need PT/OT to evaluate and treat; Recommending SNF  Hypercalcemia -Mild.  Calcium elevated at 12 in the setting of diminished p.o. intake related to changes in mental status -Improving and Ca2+ is now 10.7 -C/w IVF as below   Abnormal UA, concern for UTI.   -Patient family does report malodorous urine and history of chronic UTIs.   -Urinalysis showed turbid appearance with amber color urine, moderate hemoglobin, moderate leukocytes, greater than 300 protein, many bacteria, 21-50 RBCs per high-power field, and 21-50 WBCs -Empirically on IV Rocephin and will continue -Culture showed multiple bacterial morphotypes present with none predominant we will repeat urine culture and still not sent  Leukocytosis, improving.   -Peak of 23.5 proven down to 12.3 today -Likely stress leukocytosis but could also be related to potential UTI.   -Blood cultures showed NGTD at 2 Day -We will continue to closely monitor for S/Sx of Infection -Repeat CBC in AM   AKI, slowly improving.   -Most recent baseline creatinine within normal limits.  On admission creatinine 1.32, was 1.2 yesterday.   -Likely prerenal etiology related to dehydration -BUN/Cr is now 24/0.94 -Continue IV fluids but changed to D5 1/NS at 75 mL/hr yesterday given mild HyperNatremia and HyperChloremia but changed to D5W + 20 mEW of KCl at 75 mL/hr today  -Avoid Nephrotoxic Medications and Contrast dyes if possible -Continue Monitor and Trend Renal Fxn -Repeat CMP in AM  Hypothyroidism  -TSH elevated at 9, unclear if patient was adherent with Synthroid,  -Will continue 100 mcg Synthroid po Daily -Was changed to IV Synthroid 50 mcg today but changed  back as she is now on a Dysphagia 1 Diet with Nectar Thick Liquids   Hypertension -Stable -Continue home Atenolol 50 mg po Daily   Failure to Thrive -Very thin female and frail.   -Family reports only 3 days of diminished oral intake but  suspect this is been going on for longer.   -Per speech evaluation patient to remain n.p.o. is unsafe to tolerate oral diet but has now been advanced to Dysphagia 1 Diet with Nectar Thick Liquids -Family aware and would like to see if any of her symptoms are reversible in the next 24 to 48 hours before considering potential comfort care measures but holding off for now  Normocytic Anemia -Patient's Hb/Hct went from 10.8/34.4 -> 9.4/31.0 -> 10.2/33.0 -Checked Anemia Panel iron level of 20, U IBC of 112, TIBC 132, saturation ratios of 15%, ferritin level 47, folate level 9.5, and vitamin B12 level 118 -C/w Ferrous Sulfate 325 mg po TID -Continue to Monitor for S/Sx of Bleeding -Repeat CBC in AM   Underweight -Estimated body mass index is 16.61 kg/m as calculated from the following:   Height as of this encounter: 5\' 2"  (1.575 m).   Weight as of this encounter: 41.2 kg. -Nutritionist is following and recommending advancing Diet as Appropriate  Hypokalemia -Patient's K+ was 3.3 this AM -Repelte with IV KCl 20 mEQ, IV KPhos 30 mmol, and D5W + 20 mEQ of KCl at 75 mL/hr -Continue to Monitor and Replete as Necessary -Repeat CMP in AM   Hypophosphatemia -Patient's Phos Level was 1.5 this AM -Replete with IV KPhos 30 mmol -Continue to Monitor and Replete as Necessary  -Repeat Phos Level in Am   HyperNatremia/HyperChloremia -Na+ is mild at 146 and Chloride is 116 -Changed IVF as above -Continue to Monitor and Trend -Repeat CMP in AM   DVT prophylaxis: Enoxaparin 20 mg sq q24h Code Status: DO NOT RESUSCITATE  Family Communication: Discussed with Son and Daughter at Bedside Disposition Plan: Pending further workup and evaluation; SNF; Will need MRI to be done   Consultants:   None   Procedures:  None   Antimicrobials:  Anti-infectives (From admission, onward)   Start     Dose/Rate Route Frequency Ordered Stop   10/02/18 0000  cefTRIAXone (ROCEPHIN) 1 g in sodium chloride 0.9  % 100 mL IVPB     1 g 200 mL/hr over 30 Minutes Intravenous Every 24 hours 10/01/18 2321       Subjective: The patient is a thin Caucasian female who was seen and examined at bedside she is more conversive today and not as confused per family.  Nursing reports that she does not want to eat.  She denies any chest pain, lightheadedness or dizziness.  Still had some confusion to me when I saw her this morning but try to converse with me more.  It was difficult to comprehend what she was actually saying to me.  No nausea or vomiting a family states that she is looking better to them.  No other concerns or complaints at this time  Objective: Vitals:   10/04/18 0015 10/04/18 0418 10/04/18 0736 10/04/18 1230  BP: 113/62 99/62 114/63 112/61  Pulse: (!) 108 (!) 114 93 (!) 101  Resp:  19 15 18   Temp: (!) 97.4 F (36.3 C) (!) 97.5 F (36.4 C) (!) 97.5 F (36.4 C) 97.7 F (36.5 C)  TempSrc: Oral Oral Axillary Axillary  SpO2: (!) 89% 99% 100% 97%  Weight:      Height:  Intake/Output Summary (Last 24 hours) at 10/04/2018 1411 Last data filed at 10/04/2018 0800 Gross per 24 hour  Intake 354.52 ml  Output -  Net 354.52 ml   Filed Weights   10/01/18 2030  Weight: 41.2 kg   Examination: Physical Exam:  Constitutional: Thin elderly frail Caucasian female currently no acute distress appears calm and comfortable Eyes: Lids and conjunctive are normal.  Sclera anicteric ENMT: External ears and nose appear normal.  Hard of hearing Neck: Supple with no JVD Respiratory: Diminished to auscultation bilaterally with no appreciable wheezing, rales, rhonchi.  Patient not tachypneic or using any accessory muscles to breathe Cardiovascular: Regular rhythm but slightly tachycardic.  No lower extremity edema noted Abdomen: Soft, nontender, nondistended.  Bowel sounds present GU: Deferred Musculoskeletal: No contractures or cyanosis.  No joint deformities noted Skin: Skin is warm and dry no  appreciable rashes or lesions on limited skin evaluation Neurologic: Nerves II through XII grossly intact with no appreciable focal deficits but still is very hard of hearing.  She is more conversive with me this morning Psychiatric: Still has some impaired judgment insight.  She is awake and alert and oriented x1.  Normal mood and affect and is more conversive  Data Reviewed: I have personally reviewed following labs and imaging studies  CBC: Recent Labs  Lab 10/01/18 1405 10/02/18 0510 10/03/18 0459 10/04/18 0604  WBC 23.5* 18.4* 12.6* 12.3*  NEUTROABS 20.9*  --   --  10.2*  HGB 11.2* 10.8* 9.4* 10.2*  HCT 37.2 34.4* 31.0* 33.0*  MCV 97.4 96.1 98.4 97.6  PLT 374 340 266 287   Basic Metabolic Panel: Recent Labs  Lab 10/01/18 1405 10/02/18 0510 10/03/18 0459 10/04/18 0604  NA 139 142 146* 146*  K 4.6 4.3 3.6 3.3*  CL 107 111 118* 116*  CO2 24 23 22 23   GLUCOSE 106* 100* 96 126*  BUN 46* 43* 35* 24*  CREATININE 1.32* 1.20* 0.97 0.94  CALCIUM 12.6* 12.0* 11.0* 10.7*  MG  --   --   --  2.1  PHOS  --   --   --  1.5*   GFR: Estimated Creatinine Clearance: 24.8 mL/min (by C-G formula based on SCr of 0.94 mg/dL). Liver Function Tests: Recent Labs  Lab 10/01/18 1405 10/04/18 0604  AST 21 26  ALT 16 18  ALKPHOS 83 80  BILITOT 1.6* 1.0  PROT 6.9 5.7*  ALBUMIN 2.6* 2.1*   No results for input(s): LIPASE, AMYLASE in the last 168 hours. Recent Labs  Lab 10/01/18 1405  AMMONIA 21   Coagulation Profile: Recent Labs  Lab 10/01/18 1405  INR 1.24   Cardiac Enzymes: No results for input(s): CKTOTAL, CKMB, CKMBINDEX, TROPONINI in the last 168 hours. BNP (last 3 results) No results for input(s): PROBNP in the last 8760 hours. HbA1C: Recent Labs    10/02/18 0510  HGBA1C 5.3   CBG: Recent Labs  Lab 10/01/18 1432 10/02/18 0012 10/02/18 0344 10/02/18 0827 10/03/18 0622  GLUCAP 93 90 92 90 85   Lipid Profile: Recent Labs    10/02/18 0510  CHOL 163  HDL  25*  LDLCALC 108*  TRIG 150*  CHOLHDL 6.5   Thyroid Function Tests: No results for input(s): TSH, T4TOTAL, FREET4, T3FREE, THYROIDAB in the last 72 hours. Anemia Panel: Recent Labs    10/04/18 0604  VITAMINB12 118*  FOLATE 9.5  FERRITIN 487*  TIBC 132*  IRON 20*  RETICCTPCT 2.1   Sepsis Labs: No results for input(s): PROCALCITON, LATICACIDVEN  in the last 168 hours.  Recent Results (from the past 240 hour(s))  Culture, blood (routine x 2)     Status: None (Preliminary result)   Collection Time: 10/02/18  5:10 AM  Result Value Ref Range Status   Specimen Description BLOOD RIGHT ANTECUBITAL  Final   Special Requests   Final    BOTTLES DRAWN AEROBIC AND ANAEROBIC Blood Culture adequate volume   Culture   Final    NO GROWTH 2 DAYS Performed at [facility], [address] [place], [place] [address]    Report Status PENDING  Incomplete  Culture, blood (routine x 2)     Status: None (Preliminary result)   Collection Time: 10/02/18  5:18 AM  Result Value Ref Range Status   Specimen Description BLOOD RIGHT HAND  Final   Special Requests   Final    BOTTLES DRAWN AEROBIC AND ANAEROBIC Blood Culture adequate volume   Culture   Final    NO GROWTH 2 DAYS Performed at [facility], [address] [place], [place] [address]    Report Status PENDING  Incomplete  Culture, Urine     Status: None   Collection Time: 10/02/18  6:30 AM  Result Value Ref Range Status   Specimen Description URINE, CATHETERIZED  Final   Special Requests   Final    NONE Performed at [facility], [address] [place], [place] [address]    Culture   Final    Multiple bacterial morphotypes present, none predominant. Suggest appropriate recollection if clinically indicated.   Report Status 10/03/2018 FINAL  Final    Radiology Studies: No results found.  Scheduled Meds: . atenolol  50 mg Oral Daily  . cholecalciferol  1,000 Units Oral Daily  . enoxaparin (LOVENOX) injection  20  mg Subcutaneous Q24H  . escitalopram  10 mg Oral Daily  . ferrous sulfate  325 mg Oral TID PC  . levothyroxine  100 mcg Oral Q0600   Continuous Infusions: . cefTRIAXone (ROCEPHIN)  IV 1 g (10/04/18 0011)  . dextrose 5 % with KCl 20 mEq / L 20 mEq (10/04/18 0925)  . potassium PHOSPHATE IVPB (in mmol)      LOS: 3 days   Merlene Laughtermair Latif Emani Taussig, DO [facility] PAGER is on AMION  If 7PM-7AM, please contact night-coverage [email or web address] Password [email or web address] 10/04/2018, 2:11 PM

## 2018-10-04 NOTE — Care Management Important Message (Signed)
Important Message  Patient Details  Name: Natasha Beasley MRN: [id number] Date of Birth: 01/05/1925   Medicare Important Message Given:  Yes    Gregary Blackard 10/04/2018, 2:24 PM

## 2018-10-04 NOTE — Progress Notes (Signed)
  Speech Language Pathology Treatment: Dysphagia  Patient Details Name: Natasha Beasley MRN: [id number] DOB: 1925/04/20 Today's Date: 10/04/2018 Time: 2202-5427 SLP Time Calculation (min) (ACUTE ONLY): 30 min  Assessment / Plan / Recommendation Clinical Impression  Pt was seen for dysphagia treatment with her family present. Pt's nurse, Nira Conn, reported that the pt's p.o. intake has been limited and that yesterday evening she was noted to continue speaking after food was placed in her mouth and did not demonstrate awareness of the bolus. Oral was performed with moderate-maximum encouragement from SLP and family. However, the pt demonstrated difficulty following commands and was inconsistently resistant to open her mouth. The thoroughness of oral care is therefore questioned.   Pt accepted boluses of puree and nectar thick liquids via spoon without overt s/sx of aspiration. Prolonged mastication was noted with all puree boluses and she required max cues to limit speaking when solid and liquids were in her mouth. She tolerated trials of thin liquids via tsp without overt s/sx of aspiration but coughing was observed with thin liquids via cup. She required multiple breaks and encouragement to accept boluses. However, based on reports from staff and family, this appears to be an improvement compared to yesterday.   Pt's family was educated regarding the pt's performance and this SLP's concern regarding the pt not receiving adequate nutrition if her refusal to eat persists. The pt's wishes regarding non-oral alimentation (in the event that her p.o. intake does not improve) were discussed. Pt's son indicated that when the pt met with "the lawyer" she stated that she wants to live as long as she can. However, he was unsure as to whether the pt made any definitive decision regarding non-oral nutrition.     HPI HPI: Patient presents to the emergency room for evaluation of altered mental status.  Patient has a  history of dementia and she lives with family members at home. Abnormal appearance of the right temporal and frontal operculum suspicious for recent infarct. Query left side symptoms. Bilateral white matter changes most commonly due to small vessel disease. MRI pending.       SLP Plan  Continue with current plan of care       Recommendations  Diet recommendations: Dysphagia 1 (puree);Nectar-thick liquid Liquids provided via: Cup;Teaspoon Medication Administration: Crushed with puree Supervision: Full supervision/cueing for compensatory strategies Compensations: Slow rate;Small sips/bites Postural Changes and/or Swallow Maneuvers: Seated upright 90 degrees;Upright 30-60 min after meal                Oral Care Recommendations: Oral care QID Follow up Recommendations: Skilled Nursing facility SLP Visit Diagnosis: Dysphagia, unspecified (R13.10) Plan: Continue with current plan of care           Khyan Oats I. Hardin Negus, [place], [place] Office number [phone] Pager [place] 10/04/2018, 10:16 AM

## 2018-10-04 NOTE — Progress Notes (Signed)
Nutrition Follow-up  DOCUMENTATION CODES:   Underweight  INTERVENTION:  Provide Ensure Enlive po BID (thickened to nectar thick consistency), each supplement provides 350 kcal and 20 grams of protein.  Encourage adequate PO intake.   NUTRITION DIAGNOSIS:   Inadequate oral intake related to inability to eat as evidenced by NPO status; diet advanced; progressing  GOAL:   Patient will meet greater than or equal to 90% of their needs; progressing  MONITOR:   PO intake, Supplement acceptance, Diet advancement, Labs, Weight trends, I & O's, Skin  REASON FOR ASSESSMENT:   Malnutrition Screening Tool    ASSESSMENT:   85 y.o. year old female with medical history significant for advanced dementia (lives at home with 24/7 care by caregivers), HTN, HLD, hypothyroidism who presented on 10/01/2018 with unsteady gait, slurred speech, diminished appetite for the past 1 to 2 weeks and was found to have abnormal CT head imaging, hypercalcemia, AKI, presumed UTI.  Diet has been advanced to a dysphagia 1 diet with nectar thick liquids. Pt refused breakfast this AM. RD to order nutritional supplements to aid in caloric and protein needs. RD to continue to monitor. Labs and medications reviewed.   Diet Order:   Diet Order            DIET - DYS 1 Room service appropriate? Yes; Fluid consistency: Nectar Thick  Diet effective now              EDUCATION NEEDS:   Not appropriate for education at this time  Skin:  Skin Assessment: Reviewed RN Assessment  Last BM:  1/13  Height:   Ht Readings from Last 1 Encounters:  10/01/18 5\' 2"  (1.575 m)    Weight:   Wt Readings from Last 1 Encounters:  10/01/18 41.2 kg    Ideal Body Weight:  50 kg  BMI:  Body mass index is 16.61 kg/m.  Estimated Nutritional Needs:   Kcal:  1350-1550  Protein:  55-65 grams  Fluid:  >/= 1.5 L/day    Roslyn SmilingStephanie Sheril Hammond, MS, RD, LDN Pager # [phone] After hours/ weekend pager # [phone]

## 2018-10-05 ENCOUNTER — Inpatient Hospital Stay (HOSPITAL_COMMUNITY): Payer: Medicare Other

## 2018-10-05 LAB — COMPREHENSIVE METABOLIC PANEL
ALBUMIN: 1.9 g/dL — AB (ref 3.5–5.0)
ALT: 33 U/L (ref 0–44)
AST: 53 U/L — ABNORMAL HIGH (ref 15–41)
Alkaline Phosphatase: 74 U/L (ref 38–126)
Anion gap: 7 (ref 5–15)
BUN: 22 mg/dL (ref 8–23)
CO2: 20 mmol/L — ABNORMAL LOW (ref 22–32)
CREATININE: 1.12 mg/dL — AB (ref 0.44–1.00)
Calcium: 9.9 mg/dL (ref 8.9–10.3)
Chloride: 112 mmol/L — ABNORMAL HIGH (ref 98–111)
GFR calc Af Amer: 49 mL/min — ABNORMAL LOW (ref >60)
GFR calc non Af Amer: 43 mL/min — ABNORMAL LOW (ref >60)
GLUCOSE: 101 mg/dL — AB (ref 70–99)
Potassium: 4 mmol/L (ref 3.5–5.1)
Sodium: 139 mmol/L (ref 135–145)
Total Bilirubin: 0.6 mg/dL (ref 0.3–1.2)
Total Protein: 5.2 g/dL — ABNORMAL LOW (ref 6.5–8.1)

## 2018-10-05 LAB — CBC WITH DIFFERENTIAL/PLATELET
ABS IMMATURE GRANULOCYTES: 0.09 10*3/uL — AB (ref 0.00–0.07)
Basophils Absolute: 0 10*3/uL (ref 0.0–0.1)
Basophils Relative: 0 %
Eosinophils Absolute: 0.2 10*3/uL (ref 0.0–0.5)
Eosinophils Relative: 2 %
HEMATOCRIT: 31.4 % — AB (ref 36.0–46.0)
HEMOGLOBIN: 9.8 g/dL — AB (ref 12.0–15.0)
Immature Granulocytes: 1 %
LYMPHS PCT: 12 %
Lymphs Abs: 1.4 10*3/uL (ref 0.7–4.0)
MCH: 30.4 pg (ref 26.0–34.0)
MCHC: 31.2 g/dL (ref 30.0–36.0)
MCV: 97.5 fL (ref 80.0–100.0)
Monocytes Absolute: 0.8 10*3/uL (ref 0.1–1.0)
Monocytes Relative: 7 %
NEUTROS ABS: 8.8 10*3/uL — AB (ref 1.7–7.7)
Neutrophils Relative %: 78 %
Platelets: 260 10*3/uL (ref 150–400)
RBC: 3.22 MIL/uL — ABNORMAL LOW (ref 3.87–5.11)
RDW: 14.1 % (ref 11.5–15.5)
WBC: 11.2 10*3/uL — ABNORMAL HIGH (ref 4.0–10.5)
nRBC: 0 % (ref 0.0–0.2)

## 2018-10-05 LAB — PHOSPHORUS: Phosphorus: 2.4 mg/dL — ABNORMAL LOW (ref 2.5–4.6)

## 2018-10-05 LAB — GLUCOSE, CAPILLARY: Glucose-Capillary: 99 mg/dL (ref 70–99)

## 2018-10-05 LAB — MAGNESIUM: Magnesium: 1.9 mg/dL (ref 1.7–2.4)

## 2018-10-05 MED ORDER — K PHOS MONO-SOD PHOS DI & MONO 155-852-130 MG PO TABS
500.0000 mg | ORAL_TABLET | Freq: Once | ORAL | Status: AC
  Administered 2018-10-05: 500 mg via ORAL
  Filled 2018-10-05: qty 2

## 2018-10-05 MED ORDER — ENSURE ENLIVE PO LIQD
237.0000 mL | Freq: Two times a day (BID) | ORAL | Status: DC
  Administered 2018-10-05 – 2018-10-08 (×4): 237 mL via ORAL

## 2018-10-05 NOTE — Progress Notes (Signed)
OT Cancellation Note  Patient Details Name: Glenyce Munns MRN: [id number] DOB: 1924/11/16   Cancelled Treatment:    Reason Eval/Treat Not Completed: Fatigue/lethargy limiting ability to participate.  Therapist attempted to arouse pt multiple different times but unable to fully arouse to participate in treatment session.  Will continue to follow as able, as per PTA note pt with increased arousal and participation this AM.  Rosalio Loud 10/05/2018, 3:14 PM

## 2018-10-05 NOTE — Progress Notes (Signed)
PROGRESS NOTE    Natasha BjornstadJuanita Beasley  ZOX:[id number] DOB: 05/11/1925 DOA: 10/01/2018 PCP: Hali MarryAltheimer, Michael, MD   Brief Narrative:  Natasha BjornstadJuanita Beasley is a 85 y.o. year old female with medical history significant for advanced dementia (lives at home with 24/7 care by caregivers), HTN, HLD, hypothyroidism and other comorbidites who presented on 10/01/2018 with unsteady gait, slurred speech, diminished appetite for the past 1 to 2 weeks and was found to have abnormal CT head imaging, hypercalcemia, AKI, presumed UTI on Rocephin. Patient currently awaiting MRI brain for further characterization and currently being rehydrated but IVF changed to just D5W now.   Assessment & Plan:   Principal Problem:   Encephalopathy acute Active Problems:   Hypertension   HLD (hyperlipidemia)   Hypothyroidism   Dementia with behavioral disturbance (HCC)   Hypercalcemia   AKI (acute kidney injury) (HCC)   Confusion  Acute Encephalopathy in patient with known dementia. Improving  -Family reported new onset weakness, slurred speech over the past week different from her typical baseline of being able to ambulate and communicate though she does have known dementia with episodes of confusion.   -Differential includes stroke though no focal deficits on exam (no abnormalities found on CT head mention below),  infection (UTI), hypercalcemia in setting of dehydration, elevated TSH.   -Encouragingly glucose, ammonia are within normal limits. -Continue neurochecks.   -See Below -Still awaiting MRI  Abnormal Appearance of right temporal frontal area on CT Head -Concern for infarct especially in setting of new symptoms and decline from baseline over the past week.   -Family still wants to proceed with MRI brain and potential DAPT therapy if needed.   -Due to altered mental status patient unable to remain still, will need to use medication prior; MRI was attempted the night before but will be attempted again today  -Will  use IV Lorazepam 1 mg IVprn for Sedation for MRI -Will need PT/OT to evaluate and treat; Recommending SNF  Hypercalcemia -Mild.  Calcium elevated at 12 in the setting of diminished p.o. intake related to changes in mental status -Improving and Ca2+ is now 9.9 -C/w IVF as below and D/C after today   Abnormal UA, concern for UTI.   -Patient family does report malodorous urine and history of chronic UTIs.   -Urinalysis showed turbid appearance with amber color urine, moderate hemoglobin, moderate leukocytes, greater than 300 protein, many bacteria, 21-50 RBCs per high-power field, and 21-50 WBCs -Empirically on IV Rocephin and will continue for 7 days (Day 5/7)  -Culture showed multiple bacterial morphotypes present with none predominant we will repeat urine culture and still not sent  Leukocytosis, improving.   -Peak of 23.5 proven down to 11.2 today -Likely stress leukocytosis but could also be related to potential UTI.   -Blood cultures showed NGTD at 3 Day -We will continue to closely monitor for S/Sx of Infection -Repeat CBC in AM   AKI, slowly improving.   -Most recent baseline creatinine within normal limits.  On admission creatinine 1.32, was 1.2 yesterday.   -Likely prerenal etiology related to dehydration -BUN/Cr is now 22/1.12 -Continue IV fluids but changed to D5 1/NS at 75 mL/hr yesterday given mild HyperNatremia and HyperChloremia but changed to D5W + 20 mEW of KCl at 75 mL/hr today  -Avoid Nephrotoxic Medications and Contrast dyes if possible -Continue Monitor and Trend Renal Fxn -Repeat CMP in AM  Hypothyroidism  -TSH elevated at 9, unclear if patient was adherent with Synthroid,  -Will continue 100 mcg Synthroid po  Daily  Hypertension -Stable -Continue home Atenolol 50 mg po Daily   Failure to Thrive -Very thin female and frail.   -Family reports only 3 days of diminished oral intake but suspect this is been going on for longer.   -Per speech evaluation  patient to remain n.p.o. is unsafe to tolerate oral diet but has now been advanced to Dysphagia 1 Diet with Nectar Thick Liquids; Starting to eat a little bit more -Family aware and would like to see if any of her symptoms are reversible in the next 24 to 48 hours before considering potential comfort care measures but holding off for now  Normocytic Anemia -Patient's Hb/Hct went from 10.8/34.4 -> 9.4/31.0 -> 10.2/33.0 -> 9.8/31.4  -Checked Anemia Panel iron level of 20, U IBC of 112, TIBC 132, saturation ratios of 15%, ferritin level 47, folate level 9.5, and vitamin B12 level 118 -C/w Ferrous Sulfate 325 mg po TID -Continue to Monitor for S/Sx of Bleeding -Repeat CBC in AM   Underweight -Estimated body mass index is 16.61 kg/m as calculated from the following:   Height as of this encounter: 5\' 2"  (1.575 m).   Weight as of this encounter: 41.2 kg. -Nutritionist is following and recommending advancing Diet as Appropriate  Hypokalemia -Patient's K+ was 4.0 this AM -Repelte with po K Phos Neutral 500 mg po Daily  -Continue to Monitor and Replete as Necessary -Repeat CMP in AM   Hypophosphatemia, improved  -Patient's Phos Level was 2.4 this AM -Replete with po K Phos Neutral 500 mg po -Continue to Monitor and Replete as Necessary  -Repeat Phos Level in Am   HyperNatremia/HyperChloremia, improved -Na+ is mild at 139 and Chloride is 112 -Changed IVF as above to D5W -Continue to Monitor and Trend -Repeat CMP in AM   DVT prophylaxis: Enoxaparin 20 mg sq q24h Code Status: DO NOT RESUSCITATE  Family Communication: Discussed with Son and Daughter at Bedside Disposition Plan: Pending further workup and evaluation; SNF; Will need MRI to be done   Consultants:   None   Procedures:  None   Antimicrobials:  Anti-infectives (From admission, onward)   Start     Dose/Rate Route Frequency Ordered Stop   10/02/18 0000  cefTRIAXone (ROCEPHIN) 1 g in sodium chloride 0.9 % 100 mL IVPB      1 g 200 mL/hr over 30 Minutes Intravenous Every 24 hours 10/01/18 2321       Subjective: Seen and examined at bedside he was stating that she was doing better that she was done when she came in but still not back to her full baseline that she was previously feeding herself.  Denies any complaints of any pain currently.  Still awaiting MRI.  Family agreeable to going to skilled nursing facility and have asked the social worker to initiate process.  MRI still pending.  Patient is improving clinically as well as lab wise and anticipate discharge in next few days.  Objective: Vitals:   10/04/18 1953 10/05/18 0018 10/05/18 0424 10/05/18 0826  BP: (!) 117/55 110/62 (!) 97/51 (!) 114/47  Pulse: 68 87 66 78  Resp: 17 17 17    Temp: 98.1 F (36.7 C) 98 F (36.7 C) (!) 97.5 F (36.4 C) (!) 97.3 F (36.3 C)  TempSrc: Oral Oral Oral Oral  SpO2: 98% 96% 98% 100%  Weight:      Height:        Intake/Output Summary (Last 24 hours) at 10/05/2018 1008 Last data filed at 10/05/2018 0600 Gross per  24 hour  Intake 1084.39 ml  Output -  Net 1084.39 ml   Filed Weights   10/01/18 2030  Weight: 41.2 kg   Examination: Physical Exam:  Constitutional: Thin elderly frail Caucasian female currently no acute distress appears calm and is being currently fed by her family members Eyes: Conjunctive are normal.  Sclera nonicteric ENMT: External ears and nose appear normal.  Is hard of hearing Neck: Supple no JVD Respiratory: Diminished to auscultation bilaterally no appreciable wheezing, rales, rhonchi.  Patient was not tachypneic or using accessory muscles to breathe Cardiovascular: Regular rate and rhythm.  No appreciable murmurs, rubs, gallops.  No lower extremity edema noted Abdomen: Soft, nontender, nondistended.  Bowel sounds present GU: Deferred Musculoskeletal: No contractures or cyanosis.  No joint forms are noted Skin: Skin is warm and dry no appreciable rashes or lesions limited skin  evaluation Neurologic: Cranial nerves II through XII grossly intact with no appreciable focal deficits.  Is very hard of hearing. Psychiatric: And insight.  She is awake and alert and oriented x2.  Normal mood and affect.  Slightly agitated  Data Reviewed: I have personally reviewed following labs and imaging studies  CBC: Recent Labs  Lab 10/01/18 1405 10/02/18 0510 10/03/18 0459 10/04/18 0604 10/05/18 0619  WBC 23.5* 18.4* 12.6* 12.3* 11.2*  NEUTROABS 20.9*  --   --  10.2* 8.8*  HGB 11.2* 10.8* 9.4* 10.2* 9.8*  HCT 37.2 34.4* 31.0* 33.0* 31.4*  MCV 97.4 96.1 98.4 97.6 97.5  PLT 374 340 266 287 260   Basic Metabolic Panel: Recent Labs  Lab 10/01/18 1405 10/02/18 0510 10/03/18 0459 10/04/18 0604 10/05/18 0619  NA 139 142 146* 146* 139  K 4.6 4.3 3.6 3.3* 4.0  CL 107 111 118* 116* 112*  CO2 24 23 22 23  20*  GLUCOSE 106* 100* 96 126* 101*  BUN 46* 43* 35* 24* 22  CREATININE 1.32* 1.20* 0.97 0.94 1.12*  CALCIUM 12.6* 12.0* 11.0* 10.7* 9.9  MG  --   --   --  2.1 1.9  PHOS  --   --   --  1.5* 2.4*   GFR: Estimated Creatinine Clearance: 20.8 mL/min (A) (by C-G formula based on SCr of 1.12 mg/dL (H)). Liver Function Tests: Recent Labs  Lab 10/01/18 1405 10/04/18 0604 10/05/18 0619  AST 21 26 53*  ALT 16 18 33  ALKPHOS 83 80 74  BILITOT 1.6* 1.0 0.6  PROT 6.9 5.7* 5.2*  ALBUMIN 2.6* 2.1* 1.9*   No results for input(s): LIPASE, AMYLASE in the last 168 hours. Recent Labs  Lab 10/01/18 1405  AMMONIA 21   Coagulation Profile: Recent Labs  Lab 10/01/18 1405  INR 1.24   Cardiac Enzymes: No results for input(s): CKTOTAL, CKMB, CKMBINDEX, TROPONINI in the last 168 hours. BNP (last 3 results) No results for input(s): PROBNP in the last 8760 hours. HbA1C: No results for input(s): HGBA1C in the last 72 hours. CBG: Recent Labs  Lab 10/02/18 0012 10/02/18 0344 10/02/18 0827 10/03/18 0622 10/05/18 0621  GLUCAP 90 92 90 85 99   Lipid Profile: No results  for input(s): CHOL, HDL, LDLCALC, TRIG, CHOLHDL, LDLDIRECT in the last 72 hours. Thyroid Function Tests: No results for input(s): TSH, T4TOTAL, FREET4, T3FREE, THYROIDAB in the last 72 hours. Anemia Panel: Recent Labs    10/04/18 0604  VITAMINB12 118*  FOLATE 9.5  FERRITIN 487*  TIBC 132*  IRON 20*  RETICCTPCT 2.1   Sepsis Labs: No results for input(s): PROCALCITON, LATICACIDVEN in the  last 168 hours.  Recent Results (from the past 240 hour(s))  Culture, blood (routine x 2)     Status: None (Preliminary result)   Collection Time: 10/02/18  5:10 AM  Result Value Ref Range Status   Specimen Description BLOOD RIGHT ANTECUBITAL  Final   Special Requests   Final    BOTTLES DRAWN AEROBIC AND ANAEROBIC Blood Culture adequate volume   Culture   Final    NO GROWTH 3 DAYS Performed at [facility], [address], [place], [place] [address]    Report Status PENDING  Incomplete  Culture, blood (routine x 2)     Status: None (Preliminary result)   Collection Time: 10/02/18  5:18 AM  Result Value Ref Range Status   Specimen Description BLOOD RIGHT HAND  Final   Special Requests   Final    BOTTLES DRAWN AEROBIC AND ANAEROBIC Blood Culture adequate volume   Culture   Final    NO GROWTH 3 DAYS Performed at [facility], [address], [place], [place] [address]    Report Status PENDING  Incomplete  Culture, Urine     Status: None   Collection Time: 10/02/18  6:30 AM  Result Value Ref Range Status   Specimen Description URINE, CATHETERIZED  Final   Special Requests   Final    NONE Performed at [facility], [address], [place], [place] [address]    Culture   Final    Multiple bacterial morphotypes present, none predominant. Suggest appropriate recollection if clinically indicated.   Report Status 10/03/2018 FINAL  Final    Radiology Studies: No results found.  Scheduled Meds: . atenolol  50 mg Oral Daily  . cholecalciferol  1,000 Units Oral Daily  .  enoxaparin (LOVENOX) injection  20 mg Subcutaneous Q24H  . escitalopram  10 mg Oral Daily  . feeding supplement (ENSURE ENLIVE)  237 mL Oral BID BM  . ferrous sulfate  325 mg Oral TID PC  . levothyroxine  100 mcg Oral Q0600  . phosphorus  500 mg Oral Once   Continuous Infusions: . cefTRIAXone (ROCEPHIN)  IV Stopped (10/05/18 0736)  . dextrose 5 % with KCl 20 mEq / L 75 mL/hr at 10/04/18 1825    LOS: 4 days   Merlene Laughter, DO [facility] PAGER is on AMION  If 7PM-7AM, please contact night-coverage [email or web address] Password [email or web address] 10/05/2018, 10:08 AM

## 2018-10-05 NOTE — Clinical Social Work Note (Signed)
Clinical Social Work Assessment  Patient Details  Name: Natasha Beasley MRN: [id number] Date of Birth: 06-16-1925  Date of referral:  10/05/18               Reason for consult:  Facility Placement                Permission sought to share information with:  Facility Sport and exercise psychologist, Family Supports Permission granted to share information::  Yes, Verbal Permission Granted  Name::     Water engineer::  [place]  Relationship::  Son  Sport and exercise psychologist Information:     Housing/Transportation Living arrangements for the past 2 months:  [place] of Information:  Medical Team, Adult Children Patient Interpreter Needed:  None Criminal Activity/Legal Involvement Pertinent to Current Situation/Hospitalization:  No - Comment as needed Significant Relationships:  Adult Children, Spouse Lives with:  Self, Spouse Do you feel safe going back to the place where you live?  Yes Need for family participation in patient care:  Yes (Comment)  Care giving concerns:  Patient from home with spouse and caregivers, but family has been struggling with providing care for her. Patient will need placement at SNF at discharge.    Social Worker assessment / plan:  CSW met with patient's son to discuss placement with him. Per son, he has been working on getting the patient placed in a nursing home and is hopeful for [facility]. CSW contacted [facility] to provide referral, and they will initiate insurance authorization.  Employment status:  Retired Nurse, adult PT Recommendations:  [place] / Referral to community resources:  [place]  Patient/Family's Response to care:  Patient's son agreeable to SNF.  Patient/Family's Understanding of and Emotional Response to Diagnosis, Current Treatment, and Prognosis:  Patient's son discussed how he knows they cannot provide care for her at home anymore, and that she needs to go  into a nursing home. Patient's son appreciative of CSW assistance.  Emotional Assessment Appearance:  Appears stated age Attitude/Demeanor/Rapport:  Unable to Assess Affect (typically observed):  Unable to Assess Orientation:  Oriented to Self Alcohol / Substance use:  Not Applicable Psych involvement (Current and /or in the community):  No (Comment)  Discharge Needs  Concerns to be addressed:  Care Coordination Readmission within the last 30 days:  No Current discharge risk:  Dependent with Mobility, Cognitively Impaired, Physical Impairment Barriers to Discharge:  Continued Medical Work up, [place], [place] 10/05/2018, 2:34 PM

## 2018-10-05 NOTE — Progress Notes (Signed)
Physical Therapy Treatment Patient Details Name: Natasha Beasley MRN: [id number] DOB: 1924/12/12 Today's Date: 10/05/2018    History of Present Illness Patient is a 85 y/o female who presents with AMS and slurred speech. Head CT-Abnormal appearance of the right temporal and frontal operculum suspicious for recent infart. PMH includes dementia, HTN, CKD, left THA, right hemiarthroplasty.    PT Comments    Pt performed functional mobility and pre gait activities to prepare for standing and to complete transfer OOB to recliner.  Pt is following commands better when given increased time and repetitive cueing.  Pt able to perform limited UE/LE strengthening with active assisted ROM.  Son present at bedside and reports patient was ambulatory a week and a1/2 prior to hospitalization.  Pt is requiring decreased assistance during today's session and will continue to benefit from skilled rehab in a post acute setting to gain strength and function and return home with support from her son and provided caregivers.  Plan next session for continued strength and progression to ambulation when able.  Pt's son is aware that patient cannot return home in her current condition he is agreeable for placement at this time for rehab.       Follow Up Recommendations  SNF;Supervision for mobility/OOB     Equipment Recommendations  None recommended by PT    Recommendations for Other Services       Precautions / Restrictions Precautions Precautions: Fall Restrictions Weight Bearing Restrictions: No    Mobility  Bed Mobility Overal bed mobility: Needs Assistance Bed Mobility: Rolling;Sidelying to Sit Rolling: Max assist Sidelying to sit: Mod assist       General bed mobility comments: Due to dementia patient required facilitation to roll, once in sidelying she was able to come to sitting edge of bed and maintain sitting unassisted x 10 min.  Pt with simple commands able to weight shift forward to prepare  for standing and weight bearing through feet.  She continues to require max VCs to maintain focus of task and quickly becomes fearful with external assistance.    Transfers Overall transfer level: Needs assistance Equipment used: None Transfers: Squat Pivot Transfers     Squat pivot transfers: Mod assist     General transfer comment: Pt performed squat pivot with support to feet and mod assistance to pivot from bed to recliner.  Pt able to follow commands for scooting back into recliner with a significant amount of increased time.    Ambulation/Gait                 Stairs             Wheelchair Mobility    Modified Rankin (Stroke Patients Only)       Balance     Sitting balance-Leahy Scale: Fair       Standing balance-Leahy Scale: Zero                              Cognition Arousal/Alertness: Awake/alert Behavior During Therapy: Flat affect Overall Cognitive Status: History of cognitive impairments - at baseline                                 General Comments: Pt able to follow commands with max VCs cues to and constant cueing to maintain focus.  She was able to hold a simple conversation but quickly uses interest.  Exercises General Exercises - Lower Extremity Heel Slides: AROM;Both;AAROM;5 reps;Supine Shoulder Exercises Shoulder Flexion: AAROM;Both;10 reps;Supine Shoulder Extension: AAROM;Both;10 reps;Supine    General Comments        Pertinent Vitals/Pain Pain Assessment: Faces Faces Pain Scale: Hurts even more Pain Location: back and legs Pain Descriptors / Indicators: Grimacing;Guarding Pain Intervention(s): Monitored during session;Repositioned    Home Living                      Prior Function            PT Goals (current goals can now be found in the care plan section) Acute Rehab PT Goals Patient Stated Goal: "To take a nap." Potential to Achieve Goals: Fair Progress towards PT  goals: Progressing toward goals    Frequency    Min 2X/week      PT Plan Current plan remains appropriate    Co-evaluation              AM-PAC PT "6 Clicks" Mobility   Outcome Measure  Help needed turning from your back to your side while in a flat bed without using bedrails?: A Lot Help needed moving from lying on your back to sitting on the side of a flat bed without using bedrails?: A Lot Help needed moving to and from a bed to a chair (including a wheelchair)?: A Lot Help needed standing up from a chair using your arms (e.g., wheelchair or bedside chair)?: A Lot Help needed to walk in hospital room?: Total Help needed climbing 3-5 steps with a railing? : Total 6 Click Score: 10    End of Session Equipment Utilized During Treatment: Gait belt Activity Tolerance: Other (comment)(Tx required increased time due to cognitive deficits.  ) Patient left: in chair;with call bell/phone within reach;with chair alarm set;with family/visitor present Nurse Communication: Mobility status PT Visit Diagnosis: Muscle weakness (generalized) (M62.81);Difficulty in walking, not elsewhere classified (R26.2)     Time: [id number] PT Time Calculation (min) (ACUTE ONLY): 36 min  Charges:  $Gait Training: 8-22 mins $Therapeutic Exercise: 8-22 mins                     Joycelyn RuaAimee Codee Tutson, PTA Acute Rehabilitation Services Pager [phone] Office [phone]     Breannah Kratt Artis DelayJ Avangelina Flight 10/05/2018, 12:09 PM

## 2018-10-05 NOTE — Plan of Care (Signed)
Patient stable, discussed POC with patient and son, SW involved in POC, denies question/concerns at this time.

## 2018-10-05 NOTE — [place] FL2 (Signed)
[place] MEDICAID FL2 LEVEL OF CARE SCREENING TOOL     IDENTIFICATION  Patient Name: Natasha Beasley Birthdate: 01/05/1925 Sex: female Admission Date (Current Location): 10/01/2018  [facility] and [place] Number:  Producer, television/film/video and Address:  [facility] [place]. [facility], [address], [place], [place] [address]      Provider Number: [id number]  Attending Physician Name and Address:  Merlene Laughter, DO  Relative Name and Phone Number:       Current Level of Care: Hospital Recommended Level of Care: Skilled Nursing Facility Prior Approval Number:    Date Approved/Denied:   PASRR Number: [id number]  Discharge Plan: SNF    Current Diagnoses: Patient Active Problem List   Diagnosis Date Noted  . Hypercalcemia 10/02/2018  . AKI (acute kidney injury) (HCC) 10/02/2018  . Confusion 10/02/2018  . Encephalopathy acute 10/01/2018  . Dementia with behavioral disturbance (HCC) 10/01/2018  . S/P right hip hemi 12/07/2015  . Closed right hip fracture (HCC) 11/08/2015  . ARF (acute renal failure) (HCC) 11/08/2015  . Hypertension 11/08/2015  . HLD (hyperlipidemia) 11/08/2015  . Hypothyroidism 11/08/2015  . Hip fracture (HCC) 11/08/2015    Orientation RESPIRATION BLADDER Height & Weight     Self  Normal Incontinent Weight: 90 lb 13.3 oz (41.2 kg) Height:  5\' 2"  (157.5 cm)  BEHAVIORAL SYMPTOMS/MOOD NEUROLOGICAL BOWEL NUTRITION STATUS      Incontinent Diet(see DC summary)  AMBULATORY STATUS COMMUNICATION OF NEEDS Skin   Extensive Assist Verbally Normal                       Personal Care Assistance Level of Assistance  Bathing, Feeding, Dressing Bathing Assistance: Maximum assistance Feeding assistance: Maximum assistance Dressing Assistance: Maximum assistance     Functional Limitations Info  Sight, Hearing, Speech Sight Info: Adequate Hearing Info: Adequate Speech Info: Impaired(dysarthria)    SPECIAL CARE FACTORS FREQUENCY  PT (By  licensed PT), OT (By licensed OT), Speech therapy     PT Frequency: 5x/wk OT Frequency: 5x/wk     Speech Therapy Frequency: 5x/wk      Contractures Contractures Info: Not present    Additional Factors Info  Code Status, Allergies, Psychotropic Code Status Info: DNR Allergies Info: Hydrocodone-acetaminophen Psychotropic Info: Lexapro 10mg  daily         Current Medications (10/05/2018):  This is the current hospital active medication list Current Facility-Administered Medications  Medication Dose Route Frequency Provider Last Rate Last Dose  . acetaminophen (TYLENOL) tablet 650 mg  650 mg Oral Q6H PRN Ghimire, Werner Lean, MD       Or  . acetaminophen (TYLENOL) suppository 650 mg  650 mg Rectal Q6H PRN Ghimire, Werner Lean, MD      . albuterol (PROVENTIL) (2.5 MG/3ML) 0.083% nebulizer solution 2.5 mg  2.5 mg Nebulization Q2H PRN Ghimire, Werner Lean, MD      . atenolol (TENORMIN) tablet 50 mg  50 mg Oral Daily Marguerita Merles [place], DO   Stopped at 10/05/18 1124  . cefTRIAXone (ROCEPHIN) 1 g in sodium chloride 0.9 % 100 mL IVPB  1 g Intravenous Q24H Maretta Bees, MD   Stopped at 10/05/18 [phone]  . cholecalciferol (VITAMIN D3) tablet 1,000 Units  1,000 Units Oral Daily Maretta Bees, MD   1,000 Units at 10/05/18 1100  . dextrose 5 % with KCl 20 mEq / L  infusion  20 mEq Intravenous Continuous Marguerita Merles [place], DO 75 mL/hr at 10/04/18 1825    .  enoxaparin (LOVENOX) injection 20 mg  20 mg Subcutaneous Q24H Maretta BeesGhimire, Shanker M, MD   20 mg at 10/04/18 1201  . escitalopram (LEXAPRO) tablet 10 mg  10 mg Oral Daily Maretta BeesGhimire, Shanker M, MD   10 mg at 10/05/18 1100  . feeding supplement (ENSURE ENLIVE) (ENSURE ENLIVE) liquid 237 mL  237 mL Oral BID BM Sheikh, Omair Latif, DO   237 mL at 10/05/18 1100  . ferrous sulfate tablet 325 mg  325 mg Oral TID PC Maretta BeesGhimire, Shanker M, MD   325 mg at 10/05/18 1114  . levothyroxine (SYNTHROID, LEVOTHROID) tablet 100 mcg  100 mcg Oral Q0600 Marguerita MerlesSheikh, Omair  CoamoLatif, DO   100 mcg at 10/05/18 1124  . ondansetron (ZOFRAN) tablet 4 mg  4 mg Oral Q6H PRN Ghimire, Werner LeanShanker M, MD       Or  . ondansetron (ZOFRAN) injection 4 mg  4 mg Intravenous Q6H PRN Ghimire, Werner LeanShanker M, MD      . senna-docusate (Senokot-S) tablet 1 tablet  1 tablet Oral Daily PRN Ghimire, Werner LeanShanker M, MD         Discharge Medications: Please see discharge summary for a list of discharge medications.  Relevant Imaging Results:  Relevant Lab Results:   Additional Information SS#: [id number]  Baldemar LenisElizabeth M Raeonna Milo, LCSW

## 2018-10-06 LAB — CBC WITH DIFFERENTIAL/PLATELET
Abs Immature Granulocytes: 0.15 10*3/uL — ABNORMAL HIGH (ref 0.00–0.07)
BASOS ABS: 0 10*3/uL (ref 0.0–0.1)
Basophils Relative: 0 %
Eosinophils Absolute: 0.1 10*3/uL (ref 0.0–0.5)
Eosinophils Relative: 1 %
HCT: 33 % — ABNORMAL LOW (ref 36.0–46.0)
Hemoglobin: 10 g/dL — ABNORMAL LOW (ref 12.0–15.0)
Immature Granulocytes: 1 %
LYMPHS ABS: 1.5 10*3/uL (ref 0.7–4.0)
Lymphocytes Relative: 11 %
MCH: 28.6 pg (ref 26.0–34.0)
MCHC: 30.3 g/dL (ref 30.0–36.0)
MCV: 94.3 fL (ref 80.0–100.0)
Monocytes Absolute: 0.8 10*3/uL (ref 0.1–1.0)
Monocytes Relative: 6 %
NRBC: 0 % (ref 0.0–0.2)
Neutro Abs: 11 10*3/uL — ABNORMAL HIGH (ref 1.7–7.7)
Neutrophils Relative %: 81 %
Platelets: 241 10*3/uL (ref 150–400)
RBC: 3.5 MIL/uL — ABNORMAL LOW (ref 3.87–5.11)
RDW: 14.2 % (ref 11.5–15.5)
WBC: 13.5 10*3/uL — ABNORMAL HIGH (ref 4.0–10.5)

## 2018-10-06 LAB — COMPREHENSIVE METABOLIC PANEL
ALBUMIN: 2 g/dL — AB (ref 3.5–5.0)
ALT: 29 U/L (ref 0–44)
AST: 37 U/L (ref 15–41)
Alkaline Phosphatase: 81 U/L (ref 38–126)
Anion gap: 9 (ref 5–15)
BUN: 24 mg/dL — ABNORMAL HIGH (ref 8–23)
CO2: 19 mmol/L — ABNORMAL LOW (ref 22–32)
Calcium: 10 mg/dL (ref 8.9–10.3)
Chloride: 107 mmol/L (ref 98–111)
Creatinine, Ser: 1.06 mg/dL — ABNORMAL HIGH (ref 0.44–1.00)
GFR calc Af Amer: 53 mL/min — ABNORMAL LOW (ref >60)
GFR calc non Af Amer: 46 mL/min — ABNORMAL LOW (ref >60)
Glucose, Bld: 117 mg/dL — ABNORMAL HIGH (ref 70–99)
POTASSIUM: 4.5 mmol/L (ref 3.5–5.1)
Sodium: 135 mmol/L (ref 135–145)
Total Bilirubin: 1 mg/dL (ref 0.3–1.2)
Total Protein: 5.6 g/dL — ABNORMAL LOW (ref 6.5–8.1)

## 2018-10-06 LAB — PHOSPHORUS: Phosphorus: 2.5 mg/dL (ref 2.5–4.6)

## 2018-10-06 LAB — MAGNESIUM: Magnesium: 1.8 mg/dL (ref 1.7–2.4)

## 2018-10-06 LAB — GLUCOSE, CAPILLARY: Glucose-Capillary: 109 mg/dL — ABNORMAL HIGH (ref 70–99)

## 2018-10-06 MED ORDER — SODIUM CHLORIDE 0.9 % IV BOLUS
500.0000 mL | Freq: Once | INTRAVENOUS | Status: AC
  Administered 2018-10-06: 500 mL via INTRAVENOUS

## 2018-10-06 NOTE — Progress Notes (Signed)
Patient is requesting [employer]. They are unable to start insurance authorization over the weekend.   Facility will start insurance authorization on Monday.   CSW will continue to follow.   Drucilla Schmidt, MSW, LCSW-A Clinical Social Worker [employer]

## 2018-10-06 NOTE — Progress Notes (Signed)
PROGRESS NOTE    Natasha Beasley  VWU:[id number] DOB: 07-Apr-1925 DOA: 10/01/2018 PCP: Hali Marry, MD   Brief Narrative:  Natasha Beasley is a 85 y.o. year old female with medical history significant for advanced dementia (lives at home with 24/7 care by caregivers), HTN, HLD, hypothyroidism and other comorbidites who presented on 10/01/2018 with unsteady gait, slurred speech, diminished appetite for the past 1 to 2 weeks and was found to have abnormal CT head imaging, hypercalcemia, AKI, presumed UTI on Rocephin. Patient had brain MRI and was negative for any acute CVA.  IV fluid resuscitation is now been stopped however she was given a bolus of normal saline this morning because of poor urine output per nursing staff overnight.  Is found out yesterday that her IV infiltrated and leaking to her arm so it is unclear how much fluid she actually received yesterday.  Assessment & Plan:   Principal Problem:   Encephalopathy acute Active Problems:   Hypertension   HLD (hyperlipidemia)   Hypothyroidism   Dementia with behavioral disturbance (HCC)   Hypercalcemia   AKI (acute kidney injury) (HCC)   Confusion  Acute Encephalopathy in patient with known dementia. Improving  -Family reported new onset weakness, slurred speech over the past week different from her typical baseline of being able to ambulate and communicate though she does have known dementia with episodes of confusion.   -Differential includes stroke though no focal deficits on exam (no abnormalities found on CT head mention below),  infection (UTI), hypercalcemia in setting of dehydration, elevated TSH.   -Encouragingly glucose, ammonia are within normal limits. -Continue neurochecks.   -See Below -MRI of the brain with contrast showed no acute or subacute insult.  There is chronic small vessel ischemic changes throughout the cerebral hemispheric white matter which is age-related atrophy.  The right frontal findings at CT  question previously do not represent infarction.  Abnormal Appearance of right temporal frontal area on CT Head -Concern for infarct especially in setting of new symptoms and decline from baseline over the past week.   -Family still wants to proceed with MRI brain and potential DAPT therapy if needed.   -Due to altered mental status patient unable to remain still, will need to use medication prior; MRI was done last night and showed no acute or subacute insult.  There is chronic small vessel ischemic changes throughout the cerebral hemispheric white matter.  There is also age-related atrophy.  Right frontal findings a CT question previously do not represent infarction -Used IV Lorazepam 1 mg IVprn for Sedation for MRI -Will need PT/OT to evaluate and treat; Recommending SNF and now awaiting Insurance Authorization   Hypercalcemia -Mild.  Calcium elevated at 12 in the setting of diminished p.o. intake related to changes in mental status -Improving and Ca2+ is now 10.0 -IVF was D/C'd today however nursing reported she had poor UOP overnight and Bladder Scan showed 80 mL. Will bolus 500 mL and continue to Monitor   Abnormal UA, concern for UTI.   -Patient family does report malodorous urine and history of chronic UTIs.   -Urinalysis showed turbid appearance with amber color urine, moderate hemoglobin, moderate leukocytes, greater than 300 protein, many bacteria, 21-50 RBCs per high-power field, and 21-50 WBCs -Empirically on IV Rocephin and will continue for 7 days (Day 6/7)  -Culture showed multiple bacterial morphotypes present with none predominant we will repeat urine culture and still not sent  Leukocytosis, improving.   -Peak of 23.5 and trending down to 11.2  yesterday but slightly elevated today at 13.5 -Likely stress leukocytosis but could also be related to potential UTI.   -Blood cultures showed NGTD at 3 Day still -We will continue to closely monitor for S/Sx of Infection -Repeat  CBC in AM   AKI, slowly improving.   -Most recent baseline creatinine within normal limits.  On admission creatinine 1.32, was 1.2 yesterday.   -Likely prerenal etiology related to dehydration -BUN/Cr is now 24/1.06 -IVF now D/C'd but because of report of poor UOP overnight will give 500 mL Bolus. IV Infiltrated yesterday night and patient's IVF leaked into her arm -Avoid Nephrotoxic Medications and Contrast dyes if possible -Continue Monitor and Trend Renal Fxn -Repeat CMP in AM  Hypothyroidism  -TSH elevated at 9, unclear if patient was adherent with Synthroid,  -Will continue 100 mcg Synthroid po Daily  Hypertension -Stable -Continue home Atenolol 50 mg po Daily   Failure to Thrive -Very thin female and frail.   -Family reports only 3 days of diminished oral intake but suspect this is been going on for longer.   -Now on Dysphagia 1 Diet with Nectar Thick Liquids -Family wanting to take patient to SNF  Normocytic Anemia -Patient's Hb/Hct went from 10.8/34.4 -> 9.4/31.0 -> 10.2/33.0 -> 9.8/31.4 -> 10.0/33.0 -Checked Anemia Panel iron level of 20, U IBC of 112, TIBC 132, saturation ratios of 15%, ferritin level 47, folate level 9.5, and vitamin B12 level 118 -C/w Ferrous Sulfate 325 mg po TID -Continue to Monitor for S/Sx of Bleeding -Repeat CBC in AM   Underweight -Estimated body mass index is 16.61 kg/m as calculated from the following:   Height as of this encounter: 5\' 2"  (1.575 m).   Weight as of this encounter: 41.2 kg. -Nutritionist is following and recommending advancing Diet as Appropriate  Hypokalemia -Patient's K+ was 4.5 this AM -Repelte with po K Phos Neutral 500 mg po yesterday  -Continue to Monitor and Replete as Necessary -Repeat CMP in AM   Hypophosphatemia, improved  -Patient's Phos Level was 2.5 this AM -Replete with po K Phos Neutral 500 mg po yesterday  -Continue to Monitor and Replete as Necessary  -Repeat Phos Level in Am    HyperNatremia/HyperChloremia, improved -Na+ is now 135 and Chloride is 107 -IVF now Discontinued but will give bolus -Continue to Monitor and Trend -Repeat CMP in AM   Poor Urine Output -Continue to Monitor Strict I's/O's; Patient is +3.079 Liters since admission  -Stopped Maintenance IVF for now and gave 500 mL bolus -Bladder scan showed 80 mL; Discussed with nurse and PureWick is working fine -IV Infiltrated yesterday so will change IV -Will get Daily Weights  -Continue to Monitor closely and may need to be placed back on Maintenance IVF  DVT prophylaxis: Enoxaparin 20 mg sq q24h Code Status: DO NOT RESUSCITATE  Family Communication: Discussed with Son and Daughter at Bedside Disposition Plan: Pending further workup and evaluation; SNF; Will need MRI to be done   Consultants:   None   Procedures:  None   Antimicrobials:  Anti-infectives (From admission, onward)   Start     Dose/Rate Route Frequency Ordered Stop   10/02/18 0000  cefTRIAXone (ROCEPHIN) 1 g in sodium chloride 0.9 % 100 mL IVPB     1 g 200 mL/hr over 30 Minutes Intravenous Every 24 hours 10/01/18 2321       Subjective: Seen and examined at bedside and she was awake but family states that she was somnolent last night after she received Ativan  for MRI.  Family is concerned that she keeps crossing her legs but they state that she does this quite frequently.  I asked the family why falls and they deny any falls.  Patient denies chest pain.  Family states that she is eating a little bit but not as much as she was on the fourth when she was feeding herself.  I discussed the MRI with the family and they are happy to hear that she did not have a stroke. Patient herself states that she is doing okay but is complaining of being cold, anticipate discharge in the next 48 hours as she is improving and awaiting insurance authorization.  Answered all the patient's family's questions.  Objective: Vitals:   10/05/18 2343  10/06/18 0418 10/06/18 0815 10/06/18 1147  BP: 97/67 107/67 (!) 103/57 104/71  Pulse: (!) 109 60 (!) 47 (!) 117  Resp: 18 17 16 16   Temp: 99.2 F (37.3 C) 98.4 F (36.9 C) 97.8 F (36.6 C) (!) 97.5 F (36.4 C)  TempSrc: Oral Axillary Oral Oral  SpO2: 97% 95% 91% 99%  Weight:      Height:        Intake/Output Summary (Last 24 hours) at 10/06/2018 1336 Last data filed at 10/06/2018 1145 Gross per 24 hour  Intake 360 ml  Output -  Net 360 ml   Filed Weights   10/01/18 2030  Weight: 41.2 kg   Examination: Physical Exam:  Constitutional: Thin, elderly, frail Caucasian demented female currently no acute distress appears calm and sitting in bed but complaining of being cold Eyes: Conjunctive are normal.  Sclerae anicteric ENMT: External ears and nose appear normal.  Is very hard of hearing Neck: Appears supple no JVD Respiratory: Diminished auscultation bilaterally no appreciable wheezing, rales or rhonchi.  Patient not tachypneic wheezing excess muscle breathe Cardiovascular: Tachycardic rate and rhythm.  No appreciable murmurs, rubs or gallops.  No lower extremity edema noted Abdomen: Soft, nontender, nondistended.  Bowel sounds present GU: Deferred Musculoskeletal: No contractures or cyanosis.  No joint deformities are noted but she does keep crossing her legs and puts her left leg over her right leg Skin: Skin is warm and dry no appreciable rashes or lesions limited skin evaluation Neurologic: No nerves II through XII grossly intact no appreciable focal deficits Psychiatric: And insight.  She is awake and alert and oriented x2.  Has a pleasant mood and affect.  Not as agitated today  Data Reviewed: I have personally reviewed following labs and imaging studies  CBC: Recent Labs  Lab 10/01/18 1405 10/02/18 0510 10/03/18 0459 10/04/18 0604 10/05/18 0619 10/06/18 0539  WBC 23.5* 18.4* 12.6* 12.3* 11.2* 13.5*  NEUTROABS 20.9*  --   --  10.2* 8.8* 11.0*  HGB 11.2* 10.8*  9.4* 10.2* 9.8* 10.0*  HCT 37.2 34.4* 31.0* 33.0* 31.4* 33.0*  MCV 97.4 96.1 98.4 97.6 97.5 94.3  PLT 374 340 266 287 260 241   Basic Metabolic Panel: Recent Labs  Lab 10/02/18 0510 10/03/18 0459 10/04/18 0604 10/05/18 0619 10/06/18 0539  NA 142 146* 146* 139 135  K 4.3 3.6 3.3* 4.0 4.5  CL 111 118* 116* 112* 107  CO2 23 22 23  20* 19*  GLUCOSE 100* 96 126* 101* 117*  BUN 43* 35* 24* 22 24*  CREATININE 1.20* 0.97 0.94 1.12* 1.06*  CALCIUM 12.0* 11.0* 10.7* 9.9 10.0  MG  --   --  2.1 1.9 1.8  PHOS  --   --  1.5* 2.4* 2.5  GFR: Estimated Creatinine Clearance: 22 mL/min (A) (by C-G formula based on SCr of 1.06 mg/dL (H)). Liver Function Tests: Recent Labs  Lab 10/01/18 1405 10/04/18 0604 10/05/18 0619 10/06/18 0539  AST 21 26 53* 37  ALT 16 18 33 29  ALKPHOS 83 80 74 81  BILITOT 1.6* 1.0 0.6 1.0  PROT 6.9 5.7* 5.2* 5.6*  ALBUMIN 2.6* 2.1* 1.9* 2.0*   No results for input(s): LIPASE, AMYLASE in the last 168 hours. Recent Labs  Lab 10/01/18 1405  AMMONIA 21   Coagulation Profile: Recent Labs  Lab 10/01/18 1405  INR 1.24   Cardiac Enzymes: No results for input(s): CKTOTAL, CKMB, CKMBINDEX, TROPONINI in the last 168 hours. BNP (last 3 results) No results for input(s): PROBNP in the last 8760 hours. HbA1C: No results for input(s): HGBA1C in the last 72 hours. CBG: Recent Labs  Lab 10/02/18 0344 10/02/18 0827 10/03/18 0622 10/05/18 0621 10/06/18 0612  GLUCAP 92 90 85 99 109*   Lipid Profile: No results for input(s): CHOL, HDL, LDLCALC, TRIG, CHOLHDL, LDLDIRECT in the last 72 hours. Thyroid Function Tests: No results for input(s): TSH, T4TOTAL, FREET4, T3FREE, THYROIDAB in the last 72 hours. Anemia Panel: Recent Labs    10/04/18 0604  VITAMINB12 118*  FOLATE 9.5  FERRITIN 487*  TIBC 132*  IRON 20*  RETICCTPCT 2.1   Sepsis Labs: No results for input(s): PROCALCITON, LATICACIDVEN in the last 168 hours.  Recent Results (from the past 240  hour(s))  Culture, blood (routine x 2)     Status: None (Preliminary result)   Collection Time: 10/02/18  5:10 AM  Result Value Ref Range Status   Specimen Description BLOOD RIGHT ANTECUBITAL  Final   Special Requests   Final    BOTTLES DRAWN AEROBIC AND ANAEROBIC Blood Culture adequate volume   Culture   Final    NO GROWTH 3 DAYS Performed at [facility], [address] [place], [place] [address]    Report Status PENDING  Incomplete  Culture, blood (routine x 2)     Status: None (Preliminary result)   Collection Time: 10/02/18  5:18 AM  Result Value Ref Range Status   Specimen Description BLOOD RIGHT HAND  Final   Special Requests   Final    BOTTLES DRAWN AEROBIC AND ANAEROBIC Blood Culture adequate volume   Culture   Final    NO GROWTH 3 DAYS Performed at [facility], [address], [place], [place] [address]    Report Status PENDING  Incomplete  Culture, Urine     Status: None   Collection Time: 10/02/18  6:30 AM  Result Value Ref Range Status   Specimen Description URINE, CATHETERIZED  Final   Special Requests   Final    NONE Performed at [facility], [address], [place], [place] [address]    Culture   Final    Multiple bacterial morphotypes present, none predominant. Suggest appropriate recollection if clinically indicated.   Report Status 10/03/2018 FINAL  Final    Radiology Studies: Mr Brain Wo Contrast  Result Date: 10/05/2018 CLINICAL DATA:  Altered mental status. Dementia. Possible stroke. Unsteady gait and slurred speech. EXAM: MRI HEAD WITHOUT CONTRAST TECHNIQUE: Multiplanar, multiecho pulse sequences of the brain and surrounding structures were obtained without intravenous contrast. COMPARISON:  Head CT 10/01/2018 FINDINGS: Brain: Diffusion imaging does not show any acute or subacute infarction. The brainstem and cerebellum are normal for age. Cerebral hemispheres show generalized atrophy with chronic small-vessel  ischemic changes of the  deep white matter. No cortical or large vessel territory infarction. No mass lesion, hemorrhage, hydrocephalus or extra-axial collection. Vascular: Major vessels at the base of the brain show flow. Skull and upper cervical spine: Negative Sinuses/Orbits: Clear/normal Other: None IMPRESSION: No acute or subacute insult. Chronic small-vessel ischemic changes throughout the cerebral hemispheric white matter. Age related atrophy. Right frontal findings at CT questioned previously do not represent infarction. Electronically Signed   By: Paulina FusiMark  Shogry M.D.   On: 10/05/2018 12:06    Scheduled Meds: . atenolol  50 mg Oral Daily  . cholecalciferol  1,000 Units Oral Daily  . enoxaparin (LOVENOX) injection  20 mg Subcutaneous Q24H  . escitalopram  10 mg Oral Daily  . feeding supplement (ENSURE ENLIVE)  237 mL Oral BID BM  . ferrous sulfate  325 mg Oral TID PC  . levothyroxine  100 mcg Oral Q0600   Continuous Infusions: . cefTRIAXone (ROCEPHIN)  IV 1 g (10/06/18 0042)    LOS: 5 days   Merlene Laughtermair Latif Sheikh, DO [facility] PAGER is on AMION  If 7PM-7AM, please contact night-coverage [email or web address] Password [email or web address] 10/06/2018, 1:36 PM

## 2018-10-07 LAB — CULTURE, BLOOD (ROUTINE X 2)
Culture: NO GROWTH
Culture: NO GROWTH
SPECIAL REQUESTS: ADEQUATE
Special Requests: ADEQUATE

## 2018-10-07 LAB — CBC WITH DIFFERENTIAL/PLATELET
Abs Immature Granulocytes: 0.11 10*3/uL — ABNORMAL HIGH (ref 0.00–0.07)
Basophils Absolute: 0 10*3/uL (ref 0.0–0.1)
Basophils Relative: 0 %
Eosinophils Absolute: 0.2 10*3/uL (ref 0.0–0.5)
Eosinophils Relative: 2 %
HCT: 31.6 % — ABNORMAL LOW (ref 36.0–46.0)
Hemoglobin: 9.5 g/dL — ABNORMAL LOW (ref 12.0–15.0)
IMMATURE GRANULOCYTES: 1 %
Lymphocytes Relative: 12 %
Lymphs Abs: 1.3 10*3/uL (ref 0.7–4.0)
MCH: 28.6 pg (ref 26.0–34.0)
MCHC: 30.1 g/dL (ref 30.0–36.0)
MCV: 95.2 fL (ref 80.0–100.0)
MONOS PCT: 5 %
Monocytes Absolute: 0.6 10*3/uL (ref 0.1–1.0)
Neutro Abs: 8.7 10*3/uL — ABNORMAL HIGH (ref 1.7–7.7)
Neutrophils Relative %: 80 %
Platelets: 193 10*3/uL (ref 150–400)
RBC: 3.32 MIL/uL — ABNORMAL LOW (ref 3.87–5.11)
RDW: 14.2 % (ref 11.5–15.5)
WBC: 10.9 10*3/uL — ABNORMAL HIGH (ref 4.0–10.5)
nRBC: 0 % (ref 0.0–0.2)

## 2018-10-07 LAB — COMPREHENSIVE METABOLIC PANEL
ALT: 31 U/L (ref 0–44)
AST: 32 U/L (ref 15–41)
Albumin: 1.9 g/dL — ABNORMAL LOW (ref 3.5–5.0)
Alkaline Phosphatase: 79 U/L (ref 38–126)
Anion gap: 8 (ref 5–15)
BUN: 24 mg/dL — AB (ref 8–23)
CO2: 20 mmol/L — ABNORMAL LOW (ref 22–32)
Calcium: 9.8 mg/dL (ref 8.9–10.3)
Chloride: 107 mmol/L (ref 98–111)
Creatinine, Ser: 0.96 mg/dL (ref 0.44–1.00)
GFR calc Af Amer: 60 mL/min — ABNORMAL LOW (ref >60)
GFR calc non Af Amer: 51 mL/min — ABNORMAL LOW (ref >60)
Glucose, Bld: 97 mg/dL (ref 70–99)
Potassium: 4.2 mmol/L (ref 3.5–5.1)
Sodium: 135 mmol/L (ref 135–145)
Total Bilirubin: 0.6 mg/dL (ref 0.3–1.2)
Total Protein: 5.4 g/dL — ABNORMAL LOW (ref 6.5–8.1)

## 2018-10-07 LAB — GLUCOSE, CAPILLARY: Glucose-Capillary: 91 mg/dL (ref 70–99)

## 2018-10-07 LAB — PHOSPHORUS: Phosphorus: 2.2 mg/dL — ABNORMAL LOW (ref 2.5–4.6)

## 2018-10-07 LAB — MAGNESIUM: MAGNESIUM: 1.8 mg/dL (ref 1.7–2.4)

## 2018-10-07 MED ORDER — K PHOS MONO-SOD PHOS DI & MONO 155-852-130 MG PO TABS
500.0000 mg | ORAL_TABLET | Freq: Two times a day (BID) | ORAL | Status: AC
  Administered 2018-10-07 (×2): 500 mg via ORAL
  Filled 2018-10-07 (×2): qty 2

## 2018-10-07 MED ORDER — SODIUM CHLORIDE 0.9 % IV BOLUS
500.0000 mL | Freq: Once | INTRAVENOUS | Status: AC
  Administered 2018-10-07: 500 mL via INTRAVENOUS

## 2018-10-07 NOTE — Progress Notes (Signed)
PROGRESS NOTE    Natasha Beasley  MBT:[id number] DOB: November 19, 1924 DOA: 10/01/2018 PCP: Hali Marry, MD   Brief Narrative:  Natasha Beasley is a 85 y.o. year old female with medical history significant for advanced dementia (lives at home with 24/7 care by caregivers), HTN, HLD, hypothyroidism and other comorbidites who presented on 10/01/2018 with unsteady gait, slurred speech, diminished appetite for the past 1 to 2 weeks and was found to have abnormal CT head imaging, hypercalcemia, AKI, presumed UTI on Rocephin. Patient had brain MRI and was negative for any acute CVA.  IV fluid resuscitation is now been stopped however she was given a bolus of normal saline yesterday morning because of poor urine output per nursing staff overnight.  Is found out yesterday that her IV infiltrated and leaking to her arm so it is unclear how much fluid she actually received. She is improving and labs are trending towards normal but she had some mild hypotension so was given another NS bolus toady.   Assessment & Plan:   Principal Problem:   Encephalopathy acute Active Problems:   Hypertension   HLD (hyperlipidemia)   Hypothyroidism   Dementia with behavioral disturbance (HCC)   Hypercalcemia   AKI (acute kidney injury) (HCC)   Confusion  Acute Encephalopathy in patient with known dementia. Improving  -Family reported new onset weakness, slurred speech over the past week different from her typical baseline of being able to ambulate and communicate though she does have known dementia with episodes of confusion.   -Differential includes stroke though no focal deficits on exam (no abnormalities found on CT head mention below),  infection (UTI), hypercalcemia in setting of dehydration, elevated TSH.   -Encouragingly glucose, ammonia are within normal limits. -Continue neurochecks.   -See Below -MRI of the brain with contrast showed no acute or subacute insult.  There is chronic small vessel ischemic  changes throughout the cerebral hemispheric white matter which is age-related atrophy.  The right frontal findings at CT question previously do not represent infarction. -IVF stopped but gave bolus due to mild hypotension   Abnormal Appearance of right temporal frontal area on CT Head, improved  -Concern for infarct especially in setting of new symptoms and decline from baseline over the past week.   -Family still wants to proceed with MRI brain and potential DAPT therapy if needed.   -Due to altered mental status patient unable to remain still, will need to use medication prior; MRI was done last night and showed no acute or subacute insult.  There is chronic small vessel ischemic changes throughout the cerebral hemispheric white matter.  There is also age-related atrophy.  Right frontal findings a CT question previously do not represent infarction -Used IV Lorazepam 1 mg IVprn for Sedation for MRI -Will need PT/OT to evaluate and treat; Recommending SNF and now awaiting Insurance Authorization   Hypercalcemia -Mild.  Calcium elevated at 12 in the setting of diminished p.o. intake related to changes in mental status -Improving and Ca2+ is now 9.8 -IVF was D/C'd today however given bolus of 500 mL yesterday and will give 500 mL today.   Abnormal UA, concern for UTI.   -Patient family does report malodorous urine and history of chronic UTIs.   -Urinalysis showed turbid appearance with amber color urine, moderate hemoglobin, moderate leukocytes, greater than 300 protein, many bacteria, 21-50 RBCs per high-power field, and 21-50 WBCs -Empirically on IV Rocephin and will continue for 7 days (Day 7/7) and will stop today  -Culture showed  multiple bacterial morphotypes present with none predominant we will repeat urine culture and still not sent  Leukocytosis, improving.   -Peak of 23.5 and trending down to 11.2 but slightly elevated yesterday to 13.5 and is now 10.9  -Likely stress leukocytosis  but could also be related to potential UTI.   -Blood cultures showed NGTD at 5 Days  -We will continue to closely monitor for S/Sx of Infection -Repeat CBC in AM   AKI, slowly improving.   -Most recent baseline creatinine within normal limits.  On admission creatinine 1.32, was 1.2 yesterday.   -Likely prerenal etiology related to dehydration -BUN/Cr is now 24/0.96 -IVF now D/C'd but because of report of poor UOP overnight will give 500 mL Bolus. IV Infiltrated the night befpre last and patient's IVF leaked into her arm -Avoid Nephrotoxic Medications and Contrast dyes if possible -Continue Monitor and Trend Renal Fxn -Repeat CMP in AM  Hypothyroidism  -TSH elevated at 9.005, unclear if patient was adherent with Synthroid -Check Free T4 and T3 -Will continue 100 mcg Synthroid po Daily  Hypertension but currently hypotensive -BP now running on the lower side -Hold home Atenolol 50 mg po Daily now -Gave 500 mL Fluid Bolus yesterday and will get another one today   Failure to Thrive -Very thin female and frail.   -Family reports only 3 days of diminished oral intake but suspect this is been going on for longer.   -Now on Dysphagia 1 Diet with Nectar Thick Liquids -Family wanting to take patient to SNF  Normocytic Anemia -Patient's Hb/Hct went from 10.8/34.4 -> 9.4/31.0 -> 10.2/33.0 -> 9.8/31.4 -> 10.0/33.0 -> 9.5/31.6 -Checked Anemia Panel iron level of 20, U IBC of 112, TIBC 132, saturation ratios of 15%, ferritin level 47, folate level 9.5, and vitamin B12 level 118 -C/w Ferrous Sulfate 325 mg po TID -Continue to Monitor for S/Sx of Bleeding -Repeat CBC in AM   Underweight -Estimated body mass index is 21.09 kg/m as calculated from the following:   Height as of this encounter: 5\' 2"  (1.575 m).   Weight as of this encounter: 52.3 kg. -Nutritionist is following and recommending advancing Diet as Appropriate  Hypokalemia -Patient's K+ was 4.2 this AM -Repelte with po K  Phos Neutral 500 mg po yesterday  -Continue to Monitor and Replete as Necessary -Repeat CMP in AM   Hypophosphatemia, improved  -Patient's Phos Level was 2.2 this AM -Replete with po K Phos Neutral 500 mg po BID x2 Doses  -Continue to Monitor and Replete as Necessary  -Repeat Phos Level in Am   HyperNatremia/HyperChloremia, improved -Na+ is now 135 and Chloride is 107 -IVF now Discontinued but will give bolus -Continue to Monitor and Trend -Repeat CMP in AM   Poor Urine Output -Continue to Monitor Strict I's/O's; Patient is +3.319 Liters since admission  -Stopped Maintenance IVF for now and gave 500 mL bolus yesterday and will get another 500 mL Bolus -Bladder scan showed 80 mL yesterday; Discussed with nurse and PureWick is working fine -IV Infiltrated yesterday so will change IV -Will get Daily Weights; Weight is up 25 lbs  -Continue to Monitor closely and may need to be placed back on Maintenance IVF  DVT prophylaxis: Enoxaparin 20 mg sq q24h Code Status: DO NOT RESUSCITATE  Family Communication: Discussed with Son at bedside  Disposition Plan: SNF when Insurance Authorization is received   Consultants:   None   Procedures:  None   Antimicrobials:  Anti-infectives (From admission, onward)   Start  Dose/Rate Route Frequency Ordered Stop   10/02/18 0000  cefTRIAXone (ROCEPHIN) 1 g in sodium chloride 0.9 % 100 mL IVPB     1 g 200 mL/hr over 30 Minutes Intravenous Every 24 hours 10/01/18 2321       Subjective: Seen and examined at bedside doing okay.  Family happy that her labs were improved.  Still awaiting SNF authorization and they understand this may be likely on Tuesday as it is a holiday tomorrow.  Patient was pleasantly confused this morning and had no complaints.  No other concerns at this time.  Objective: Vitals:   10/07/18 0410 10/07/18 0500 10/07/18 0818 10/07/18 1203  BP: (!) 102/49  92/74 (!) 92/55  Pulse: 86  80 95  Resp: 18  18 16   Temp: 97.9  F (36.6 C)  97.7 F (36.5 C) 97.7 F (36.5 C)  TempSrc: Oral  Oral Oral  SpO2: 96%  98%   Weight:  52.3 kg    Height:        Intake/Output Summary (Last 24 hours) at 10/07/2018 1420 Last data filed at 10/06/2018 1800 Gross per 24 hour  Intake 240 ml  Output -  Net 240 ml   Filed Weights   10/01/18 2030 10/07/18 0500  Weight: 41.2 kg 52.3 kg   Examination: Physical Exam:  Constitutional: Thin, elderly, frail Caucasian female currently no acute distress appears calm but looks slightly uncomfortable Eyes: Conjunctive is normal.  Sclera anicteric ENMT: External ears and nose appear normal.  She is hard of hearing Neck: Appears supple no JVD Respiratory: Diminished auscultation bilaterally no appreciable wheezing, rales, rhonchi.  Patient not tachypneic or using any accessory muscles to breathe Cardiovascular: Regular rate and rhythm but slightly on the faster side.  No lower extremity edema noted Abdomen: Soft, nontender, nondistended.  Bowel sounds present GU: Deferred Musculoskeletal: No contractures or cyanosis.  No joint deformity noted but has her legs up and does not want to put them down Skin: Skin is warm and dry with no appreciable rashes or lesions on limited skin evaluation Neurologic: Cranial nerves II through XII grossly intact no appreciable focal deficits Psychiatric: Impaired judgment and insight.  Patient is awake and alert and oriented x2.  Slightly agitated today.  Data Reviewed: I have personally reviewed following labs and imaging studies  CBC: Recent Labs  Lab 10/01/18 1405  10/03/18 0459 10/04/18 0604 10/05/18 0619 10/06/18 0539 10/07/18 0654  WBC 23.5*   < > 12.6* 12.3* 11.2* 13.5* 10.9*  NEUTROABS 20.9*  --   --  10.2* 8.8* 11.0* 8.7*  HGB 11.2*   < > 9.4* 10.2* 9.8* 10.0* 9.5*  HCT 37.2   < > 31.0* 33.0* 31.4* 33.0* 31.6*  MCV 97.4   < > 98.4 97.6 97.5 94.3 95.2  PLT 374   < > 266 287 260 241 193   < > = values in this interval not displayed.    Basic Metabolic Panel: Recent Labs  Lab 10/03/18 0459 10/04/18 0604 10/05/18 0619 10/06/18 0539 10/07/18 0654  NA 146* 146* 139 135 135  K 3.6 3.3* 4.0 4.5 4.2  CL 118* 116* 112* 107 107  CO2 22 23 20* 19* 20*  GLUCOSE 96 126* 101* 117* 97  BUN 35* 24* 22 24* 24*  CREATININE 0.97 0.94 1.12* 1.06* 0.96  CALCIUM 11.0* 10.7* 9.9 10.0 9.8  MG  --  2.1 1.9 1.8 1.8  PHOS  --  1.5* 2.4* 2.5 2.2*   GFR: Estimated Creatinine Clearance: 29.6  mL/min (by C-G formula based on SCr of 0.96 mg/dL). Liver Function Tests: Recent Labs  Lab 10/01/18 1405 10/04/18 0604 10/05/18 0619 10/06/18 0539 10/07/18 0654  AST 21 26 53* 37 32  ALT 16 18 33 29 31  ALKPHOS 83 80 74 81 79  BILITOT 1.6* 1.0 0.6 1.0 0.6  PROT 6.9 5.7* 5.2* 5.6* 5.4*  ALBUMIN 2.6* 2.1* 1.9* 2.0* 1.9*   No results for input(s): LIPASE, AMYLASE in the last 168 hours. Recent Labs  Lab 10/01/18 1405  AMMONIA 21   Coagulation Profile: Recent Labs  Lab 10/01/18 1405  INR 1.24   Cardiac Enzymes: No results for input(s): CKTOTAL, CKMB, CKMBINDEX, TROPONINI in the last 168 hours. BNP (last 3 results) No results for input(s): PROBNP in the last 8760 hours. HbA1C: No results for input(s): HGBA1C in the last 72 hours. CBG: Recent Labs  Lab 10/02/18 0827 10/03/18 0622 10/05/18 0621 10/06/18 0612 10/07/18 0630  GLUCAP 90 85 99 109* 91   Lipid Profile: No results for input(s): CHOL, HDL, LDLCALC, TRIG, CHOLHDL, LDLDIRECT in the last 72 hours. Thyroid Function Tests: No results for input(s): TSH, T4TOTAL, FREET4, T3FREE, THYROIDAB in the last 72 hours. Anemia Panel: No results for input(s): VITAMINB12, FOLATE, FERRITIN, TIBC, IRON, RETICCTPCT in the last 72 hours. Sepsis Labs: No results for input(s): PROCALCITON, LATICACIDVEN in the last 168 hours.  Recent Results (from the past 240 hour(s))  Culture, blood (routine x 2)     Status: None   Collection Time: 10/02/18  5:10 AM  Result Value Ref Range Status    Specimen Description BLOOD RIGHT ANTECUBITAL  Final   Special Requests   Final    BOTTLES DRAWN AEROBIC AND ANAEROBIC Blood Culture adequate volume   Culture   Final    NO GROWTH 5 DAYS Performed at [facility], [address], [place], [place] [address]    Report Status 10/07/2018 FINAL  Final  Culture, blood (routine x 2)     Status: None   Collection Time: 10/02/18  5:18 AM  Result Value Ref Range Status   Specimen Description BLOOD RIGHT HAND  Final   Special Requests   Final    BOTTLES DRAWN AEROBIC AND ANAEROBIC Blood Culture adequate volume   Culture   Final    NO GROWTH 5 DAYS Performed at [facility], [address], [place], [place] [address]    Report Status 10/07/2018 FINAL  Final  Culture, Urine     Status: None   Collection Time: 10/02/18  6:30 AM  Result Value Ref Range Status   Specimen Description URINE, CATHETERIZED  Final   Special Requests   Final    NONE Performed at [facility], [address], [place], [place] [address]    Culture   Final    Multiple bacterial morphotypes present, none predominant. Suggest appropriate recollection if clinically indicated.   Report Status 10/03/2018 FINAL  Final    Radiology Studies: No results found.  Scheduled Meds: . atenolol  50 mg Oral Daily  . cholecalciferol  1,000 Units Oral Daily  . enoxaparin (LOVENOX) injection  20 mg Subcutaneous Q24H  . escitalopram  10 mg Oral Daily  . feeding supplement (ENSURE ENLIVE)  237 mL Oral BID BM  . ferrous sulfate  325 mg Oral TID PC  . levothyroxine  100 mcg Oral Q0600  . phosphorus  500 mg Oral BID   Continuous Infusions: . cefTRIAXone (ROCEPHIN)  IV Stopped (10/07/18 0909)  LOS: 6 days   Merlene Laughter, DO [facility] PAGER is on AMION  If 7PM-7AM, please contact night-coverage [email or web address] Password [email or web address] 10/07/2018, 2:20 PM

## 2018-10-07 NOTE — Plan of Care (Signed)
Patient stable, discussed POC with patient and family, awaiting insurance authorization, agreeable with plan, denies question/concerns at this time.

## 2018-10-08 LAB — CREATININE, SERUM
Creatinine, Ser: 0.93 mg/dL (ref 0.44–1.00)
GFR calc Af Amer: >60 mL/min (ref >60)
GFR calc non Af Amer: 53 mL/min — ABNORMAL LOW (ref >60)

## 2018-10-08 MED ORDER — ESCITALOPRAM OXALATE 10 MG PO TABS: 10.0000 mg | ORAL_TABLET | Freq: Every day | ORAL | Status: AC

## 2018-10-08 MED ORDER — SENNOSIDES-DOCUSATE SODIUM 8.6-50 MG PO TABS: 1.0000 | ORAL_TABLET | Freq: Every day | ORAL | Status: AC | PRN

## 2018-10-08 MED ORDER — ENSURE ENLIVE PO LIQD: 237.0000 mL | Freq: Four times a day (QID) | ORAL | 12 refills | Status: AC

## 2018-10-08 NOTE — Progress Notes (Addendum)
CSW continuing to follow for discharge plan. Attempted to contact [facility] to check on status of authorization request; admissions unavailable. CSW left a message for Admissions to call back.  Blenda Nicely, [place] Clinical Social Worker [phone]

## 2018-10-08 NOTE — Progress Notes (Signed)
Patient discharging to [facility] [employer] Nurse will call report All discharge Paper work sent with [employer]

## 2018-10-08 NOTE — Clinical Social Work Placement (Signed)
Nurse to call report to [phone], Room 112A     CLINICAL SOCIAL WORK PLACEMENT  NOTE  Date:  10/08/2018  Patient Details  Name: Natasha Beasley MRN: [id number] Date of Birth: February 25, 1925  Clinical Social Work is seeking post-discharge placement for this patient at the Skilled  Nursing Facility level of care (*CSW will initial, date and re-position this form in  chart as items are completed):  Yes   Patient/family provided with [facility] Clinical Social Work Department's list of facilities offering this level of care within the geographic area requested by the patient (or if unable, by the patient's family).  Yes   Patient/family informed of their freedom to choose among providers that offer the needed level of care, that participate in Medicare, Medicaid or managed care program needed by the patient, have an available bed and are willing to accept the patient.  Yes   Patient/family informed of [facility]'s ownership interest in [facility] and [facility], as well as of the fact that they are under no obligation to receive care at these facilities.  PASRR submitted to EDS on       PASRR number received on       Existing PASRR number confirmed on       FL2 transmitted to all facilities in geographic area requested by pt/family on       FL2 transmitted to all facilities within larger geographic area on       Patient informed that his/her managed care company has contracts with or will negotiate with certain facilities, including the following:        Yes   Patient/family informed of bed offers received.  Patient chooses bed at [facility]     Physician recommends and patient chooses bed at      Patient to be transferred to [facility] on 10/08/18.  Patient to be transferred to facility by [facility]     Patient family notified on 10/08/18 of transfer.  Name of family member notified:  Son, Chanetta Marshall     PHYSICIAN       Additional Comment:     _______________________________________________ Baldemar Lenis, LCSW 10/08/2018, 12:03 PM

## 2018-10-08 NOTE — Progress Notes (Signed)
  Speech Language Pathology Treatment: Dysphagia  Patient Details Name: Natasha Beasley MRN: [id number] DOB: January 15, 1925 Today's Date: 10/08/2018 Time: 8295-6213 SLP Time Calculation (min) (ACUTE ONLY): 25 min  Assessment / Plan / Recommendation Clinical Impression  Patient seen during breakfast. Patient's son present to report, she would not eat for him. She woke easily and was cooperative throughout session. She had prolonged mastication, poor bolus formation and piece meal swallowing of purees. She tolerated both nectar and thin liquids given by tsp, cup and straw. Patient prefers sweeten juices over tart juices. No s/sx of aspiration with liquid consistencies of all quantities. Her intake of liquids was much better than solids. Recommend Dys 1, thin liquid diet with medications crushed in puree. Speech therapy to follow to ensure tolerance of liquid upgrade. Intake seems to be improving. Son reports patient likes chocolate ensure.     HPI HPI: Patient presents to the emergency room for evaluation of altered mental status.  Patient has a history of dementia and she lives with family members at home. Abnormal appearance of the right temporal and frontal operculum suspicious for recent infarct. Query left side symptoms. Bilateral white matter changes most commonly due to small vessel disease. MRI pending.       SLP Plan  Continue with current plan of care       Recommendations  Diet recommendations: Dysphagia 1 (puree);Thin liquid Liquids provided via: Cup;Straw Medication Administration: Crushed with puree Supervision: Full supervision/cueing for compensatory strategies Compensations: Slow rate;Small sips/bites Postural Changes and/or Swallow Maneuvers: Seated upright 90 degrees;Upright 30-60 min after meal                Plan: Continue with current plan of care       GO               Lindalou Hose Paytin Ramakrishnan, MA, CCC-SLP 10/08/2018 10:12 AM

## 2018-10-08 NOTE — Discharge Summary (Signed)
Physician Discharge Summary  Natasha Beasley:[id number] DOB: Jan 01, 1925 DOA: 10/01/2018  PCP: Hali Marry, MD  Admit date: 10/01/2018 Discharge date: 10/08/2018  Admitted From: home  Disposition:  SNF   Recommendations for Outpatient Follow-up:  1. Bmet in 3-5 days 2. Consider palliative care discussions if she does not eat and drink sufficiently  Discharge Condition:  stable   CODE STATUS: DNR   Diet recommendation:  Regular diet- D1 with thin liquids Consultations:  none    Discharge Diagnoses:  Principal Problem:   Encephalopathy acute Active Problems:   AKI (acute kidney injury)   Anorexia - poor oral intake   Hypercalcemia   Hypertension   HLD (hyperlipidemia)   Hypothyroidism   Dementia with behavioral disturbance (HCC)     Brief Summary: Natasha Beasley a 85 y.o.year old femalewith medical history significant for advanced dementia (lives at home with 24/7 care by caregivers), HTN, HLD, hypothyroidism and other comorbiditeswho presented on1/13/2020with unsteady gait, slurred speech, diminished appetite for the past 1 to 2 weeksand was found to haveabnormal CT head imaging, hypercalcemia, AKI & a suspected UTI.  Hospital Course:  Acute Encephalopathy in patient with known severe dementia.  - MRI unrevealing - ? Due to dehydration and weakness in setting of UTI and hypercalcemia? - she has been quite weak now for a few weeks and private caregivers at home are not able to take care of her any longer (son states they cannot lift her) and thus he has decided for her to go to a SNF  Dehydration, AKI and poor oral intake-  - appears to have very poor oral intake, discussion with son had today that if her oral intake does not improve, she will not survive- he has been able to get her to drink about 3 drinks for breakfast this AM (including and Ensure)- she is not eating much solid food yet - will prescribed Ensure QID- follow oral intake and check Bmet in  the middle of the week- if she continues to do poorly, discussions for comfort care should be had- she is currently a DNR  Hypercalcemia - initially was 15.6 - likely related to dehydration in addition to another underlying etiology- treated with IVF and improved but not normalized- corrected calcium is still 12.8- no further work up has been done in the hospital thus far- it is doubtful that a calcium of 12 (which the calcium corrects to today) is affecting her mental status and oral intake at this time- if she is seen to have improvement in oral intake and participates with PT, further work up can be pursued.  ? UTI, leukocytosis - + UA with many bacteria and WBCs - culture showed multiple morphotypes and was unhelpful-  unable to tell if she was symptomatic due to her dementia -she has been treated for 7 days with Ceftriaxone - Leukocytosis has improved with treatment from 23.5 to normal (10.9) - blood cultures neg x 5 days   Discharge Exam: Vitals:   10/08/18 0347 10/08/18 1001  BP: 123/89 (!) 100/56  Pulse: 87 82  Resp: 20 18  Temp: (!) 97.4 F (36.3 C) (!) 97.5 F (36.4 C)  SpO2: 98% 97%   Vitals:   10/08/18 0049 10/08/18 0347 10/08/18 0500 10/08/18 1001  BP: (!) 99/59 123/89  (!) 100/56  Pulse: 90 87  82  Resp: 18 20  18   Temp: (!) 97.5 F (36.4 C) (!) 97.4 F (36.3 C)  (!) 97.5 F (36.4 C)  TempSrc: Axillary Oral  Oral  SpO2: 98% 98%  97%  Weight:   50 kg   Height:        General: Pt is alert, awake, not in acute distress- very hard of hearing- quite confused but able to follow commands Cardiovascular: RRR, S1/S2 +, no rubs, no gallops Respiratory: CTA bilaterally, no wheezing, no rhonchi Abdominal: Soft, NT, ND, bowel sounds + Extremities: no edema, no cyanosis   Discharge Instructions  Discharge Instructions    Increase activity slowly   Complete by:  As directed      Allergies as of 10/08/2018      Reactions   Hydrocodone-acetaminophen Other (See  Comments)   Hallucinations (reported by [facility] 10/03/16)      Medication List    STOP taking these medications   aspirin 325 MG EC tablet   atenolol 50 MG tablet Commonly known as:  TENORMIN   cephALEXin 250 MG capsule Commonly known as:  KEFLEX   docusate sodium 100 MG capsule Commonly known as:  COLACE   ferrous sulfate 325 (65 FE) MG tablet     TAKE these medications   acetaminophen 500 MG tablet Commonly known as:  TYLENOL Take 500 mg by mouth every 6 (six) hours as needed for headache (pain).   escitalopram 10 MG tablet Commonly known as:  LEXAPRO Take 1 tablet (10 mg total) by mouth daily. Start taking on:  October 09, 2018 What changed:    medication strength  how much to take   feeding supplement (ENSURE ENLIVE) Liqd Take 237 mLs by mouth 4 (four) times daily. Encourage her to drink all please   levothyroxine 88 MCG tablet Commonly known as:  SYNTHROID, LEVOTHROID Take 88 mcg by mouth daily before breakfast. On an empty stomach   polyethylene glycol packet Commonly known as:  MIRALAX / GLYCOLAX Take 17 g by mouth 2 (two) times daily.   senna-docusate 8.6-50 MG tablet Commonly known as:  Senokot-S Take 1 tablet by mouth daily as needed for mild constipation.   SYSTANE OP Place 1 drop into both eyes daily as needed (dry eyes).      Contact information for after-discharge care    Destination    [facility] .   Service:  Skilled Nursing Contact information: [address] [place] [address] [phone]             Allergies  Allergen Reactions  . Hydrocodone-Acetaminophen Other (See Comments)    Hallucinations (reported by [facility] 10/03/16)     Procedures/Studies:    Dg Chest 2 View  Result Date: 10/01/2018 CLINICAL DATA:  Altered mental status EXAM: CHEST - 2 VIEW COMPARISON:  11/08/2015 FINDINGS: The heart size and mediastinal contours are within normal limits. Both lungs are clear. The visualized skeletal  structures are unremarkable. IMPRESSION: No active cardiopulmonary disease. Electronically Signed   By: Deatra Robinson M.D.   On: 10/01/2018 16:01   Ct Head Wo Contrast  Result Date: 10/01/2018 CLINICAL DATA:  86 year old female with altered level of consciousness. EXAM: CT HEAD WITHOUT CONTRAST TECHNIQUE: Contiguous axial images were obtained from the base of the skull through the vertex without intravenous contrast. COMPARISON:  None. FINDINGS: Brain: Patchy bilateral white matter hypodensity. Symmetric bilateral dystrophic basal ganglia calcifications. Asymmetric and abnormal appearance of the right superior temporal lobe and frontal operculum best demonstrated on coronal image 40. No associated hemorrhage or regional mass effect. Elsewhere gray-white matter differentiation appears more normal. No midline shift, ventriculomegaly, mass effect, evidence of mass lesion. Vascular: Calcified atherosclerosis  at the skull base. Skull: No acute osseous abnormality identified. Hyperostosis, normal variant. Carious maxillary dentition. Sinuses/Orbits: Paranasal sinuses, tympanic cavities and mastoids appear clear. Other: No acute orbit or scalp soft tissue finding. Negative visible noncontrast deep soft tissue spaces of the face. IMPRESSION: 1. Abnormal appearance of the right temporal and frontal operculum suspicious for recent infarct. Query left side symptoms. No associated hemorrhage or mass effect. Brain MRI recommended (noncontrast should suffice if the clinical picture also suggests infarct). 2. Bilateral white matter changes most commonly due to small vessel disease. 3. Carious maxillary dentition. Electronically Signed   By: Odessa FlemingH  Hall M.D.   On: 10/01/2018 15:53   Mr Brain Wo Contrast  Result Date: 10/05/2018 CLINICAL DATA:  Altered mental status. Dementia. Possible stroke. Unsteady gait and slurred speech. EXAM: MRI HEAD WITHOUT CONTRAST TECHNIQUE: Multiplanar, multiecho pulse sequences of the brain and  surrounding structures were obtained without intravenous contrast. COMPARISON:  Head CT 10/01/2018 FINDINGS: Brain: Diffusion imaging does not show any acute or subacute infarction. The brainstem and cerebellum are normal for age. Cerebral hemispheres show generalized atrophy with chronic small-vessel ischemic changes of the deep white matter. No cortical or large vessel territory infarction. No mass lesion, hemorrhage, hydrocephalus or extra-axial collection. Vascular: Major vessels at the base of the brain show flow. Skull and upper cervical spine: Negative Sinuses/Orbits: Clear/normal Other: None IMPRESSION: No acute or subacute insult. Chronic small-vessel ischemic changes throughout the cerebral hemispheric white matter. Age related atrophy. Right frontal findings at CT questioned previously do not represent infarction. Electronically Signed   By: Paulina FusiMark  Shogry M.D.   On: 10/05/2018 12:06     The results of significant diagnostics from this hospitalization (including imaging, microbiology, ancillary and laboratory) are listed below for reference.     Microbiology: Recent Results (from the past 240 hour(s))  Culture, blood (routine x 2)     Status: None   Collection Time: 10/02/18  5:10 AM  Result Value Ref Range Status   Specimen Description BLOOD RIGHT ANTECUBITAL  Final   Special Requests   Final    BOTTLES DRAWN AEROBIC AND ANAEROBIC Blood Culture adequate volume   Culture   Final    NO GROWTH 5 DAYS Performed at [facility], [address] [place], [place] [address]    Report Status 10/07/2018 FINAL  Final  Culture, blood (routine x 2)     Status: None   Collection Time: 10/02/18  5:18 AM  Result Value Ref Range Status   Specimen Description BLOOD RIGHT HAND  Final   Special Requests   Final    BOTTLES DRAWN AEROBIC AND ANAEROBIC Blood Culture adequate volume   Culture   Final    NO GROWTH 5 DAYS Performed at [facility], [address] [place], [place] [address]     Report Status 10/07/2018 FINAL  Final  Culture, Urine     Status: None   Collection Time: 10/02/18  6:30 AM  Result Value Ref Range Status   Specimen Description URINE, CATHETERIZED  Final   Special Requests   Final    NONE Performed at [facility], [address] [place], [place] [address]    Culture   Final    Multiple bacterial morphotypes present, none predominant. Suggest appropriate recollection if clinically indicated.   Report Status 10/03/2018 FINAL  Final     Labs: BNP (last 3 results) No results for input(s): BNP in the last 8760 hours. Basic Metabolic Panel: Recent  Labs  Lab 10/03/18 0459 10/04/18 0604 10/05/18 0619 10/06/18 0539 10/07/18 0654 10/08/18 0856  NA 146* 146* 139 135 135  --   K 3.6 3.3* 4.0 4.5 4.2  --   CL 118* 116* 112* 107 107  --   CO2 22 23 20* 19* 20*  --   GLUCOSE 96 126* 101* 117* 97  --   BUN 35* 24* 22 24* 24*  --   CREATININE 0.97 0.94 1.12* 1.06* 0.96 0.93  CALCIUM 11.0* 10.7* 9.9 10.0 9.8  --   MG  --  2.1 1.9 1.8 1.8  --   PHOS  --  1.5* 2.4* 2.5 2.2*  --    Liver Function Tests: Recent Labs  Lab 10/01/18 1405 10/04/18 0604 10/05/18 0619 10/06/18 0539 10/07/18 0654  AST 21 26 53* 37 32  ALT 16 18 33 29 31  ALKPHOS 83 80 74 81 79  BILITOT 1.6* 1.0 0.6 1.0 0.6  PROT 6.9 5.7* 5.2* 5.6* 5.4*  ALBUMIN 2.6* 2.1* 1.9* 2.0* 1.9*   No results for input(s): LIPASE, AMYLASE in the last 168 hours. Recent Labs  Lab 10/01/18 1405  AMMONIA 21   CBC: Recent Labs  Lab 10/01/18 1405  10/03/18 0459 10/04/18 0604 10/05/18 0619 10/06/18 0539 10/07/18 0654  WBC 23.5*   < > 12.6* 12.3* 11.2* 13.5* 10.9*  NEUTROABS 20.9*  --   --  10.2* 8.8* 11.0* 8.7*  HGB 11.2*   < > 9.4* 10.2* 9.8* 10.0* 9.5*  HCT 37.2   < > 31.0* 33.0* 31.4* 33.0* 31.6*  MCV 97.4   < > 98.4 97.6 97.5 94.3 95.2  PLT 374   < > 266 287 260 241 193   < > = values in this interval not displayed.   Cardiac Enzymes: No results for input(s): CKTOTAL,  CKMB, CKMBINDEX, TROPONINI in the last 168 hours. BNP: Invalid input(s): POCBNP CBG: Recent Labs  Lab 10/02/18 0827 10/03/18 0622 10/05/18 0621 10/06/18 0612 10/07/18 0630  GLUCAP 90 85 99 109* 91   D-Dimer No results for input(s): DDIMER in the last 72 hours. Hgb A1c No results for input(s): HGBA1C in the last 72 hours. Lipid Profile No results for input(s): CHOL, HDL, LDLCALC, TRIG, CHOLHDL, LDLDIRECT in the last 72 hours. Thyroid function studies No results for input(s): TSH, T4TOTAL, T3FREE, THYROIDAB in the last 72 hours.  Invalid input(s): FREET3 Anemia work up No results for input(s): VITAMINB12, FOLATE, FERRITIN, TIBC, IRON, RETICCTPCT in the last 72 hours. Urinalysis    Component Value Date/Time   COLORURINE AMBER (A) 10/01/2018 1859   APPEARANCEUR TURBID (A) 10/01/2018 1859   LABSPEC 1.020 10/01/2018 1859   PHURINE 8.0 10/01/2018 1859   GLUCOSEU NEGATIVE 10/01/2018 1859   HGBUR MODERATE (A) 10/01/2018 1859   BILIRUBINUR NEGATIVE 10/01/2018 1859   KETONESUR NEGATIVE 10/01/2018 1859   PROTEINUR >=300 (A) 10/01/2018 1859   UROBILINOGEN 0.2 02/03/2010 1950   NITRITE NEGATIVE 10/01/2018 1859   LEUKOCYTESUR MODERATE (A) 10/01/2018 1859   Sepsis Labs Invalid input(s): PROCALCITONIN,  WBC,  LACTICIDVEN Microbiology Recent Results (from the past 240 hour(s))  Culture, blood (routine x 2)     Status: None   Collection Time: 10/02/18  5:10 AM  Result Value Ref Range Status   Specimen Description BLOOD RIGHT ANTECUBITAL  Final   Special Requests   Final    BOTTLES DRAWN AEROBIC AND ANAEROBIC Blood Culture adequate volume   Culture   Final    NO GROWTH 5 DAYS Performed at  [facility], [address] [place], [place] [address]    Report Status 10/07/2018 FINAL  Final  Culture, blood (routine x 2)     Status: None   Collection Time: 10/02/18  5:18 AM  Result Value Ref Range Status   Specimen Description BLOOD RIGHT HAND  Final   Special Requests   Final     BOTTLES DRAWN AEROBIC AND ANAEROBIC Blood Culture adequate volume   Culture   Final    NO GROWTH 5 DAYS Performed at [facility], [address] [place], [place] [address]    Report Status 10/07/2018 FINAL  Final  Culture, Urine     Status: None   Collection Time: 10/02/18  6:30 AM  Result Value Ref Range Status   Specimen Description URINE, CATHETERIZED  Final   Special Requests   Final    NONE Performed at [facility], [address] [place], [place] [address]    Culture   Final    Multiple bacterial morphotypes present, none predominant. Suggest appropriate recollection if clinically indicated.   Report Status 10/03/2018 FINAL  Final     Time coordinating discharge in minutes: 65  SIGNED:   Calvert CantorSaima Lisbeth Puller, MD  [facility] 10/08/2018, 11:40 AM Pager   If 7PM-7AM, please contact night-coverage [email or web address] Password [email or web address]

## 2018-10-08 NOTE — Progress Notes (Signed)
Occupational Therapy Treatment Patient Details Name: Natasha BjornstadJuanita Beasley MRN: [id number] DOB: 07/15/1925 Today's Date: 10/08/2018    History of present illness Patient is a 85 y/o female who presents with AMS and slurred speech. Head CT-Abnormal appearance of the right temporal and frontal operculum suspicious for recent infart. PMH includes dementia, HTN, CKD, left THA, right hemiarthroplasty.   OT comments  Upon entering the room, pt supine in bed with son present in room. Session focused on pt initiation and sequencing. Pt required initial hand over hand assistance for all grooming task but did continue combing hair on her own with max cuing and encouragement. Pt resistive to sitting EOB and therapist unable to assist pt due to this behavior. Pt remained in bed with bed alarm activated and call bell within reach. Son remains present in room.    Follow Up Recommendations  Supervision/Assistance - 24 hour;SNF    Equipment Recommendations  None recommended by OT       Precautions / Restrictions Precautions Precautions: Fall       Mobility Bed Mobility        General bed mobility comments: refused and very resistive to movement this session         ADL either performed or assessed with clinical judgement   ADL       Grooming: Moderate assistance;Bed level                      Cognition Arousal/Alertness: Awake/alert Behavior During Therapy: Flat affect Overall Cognitive Status: History of cognitive impairments - at baseline            General Comments: Pt able to follow commands with max VCs cues to and constant cueing to maintain focus.                     Pertinent Vitals/ Pain       Pain Assessment: Faces Faces Pain Scale: No hurt         Frequency  Min 2X/week        Progress Toward Goals  OT Goals(current goals can now be found in the care plan section)  Progress towards OT goals: Progressing toward goals  Acute Rehab OT  Goals Patient Stated Goal: none stated  Plan Discharge plan remains appropriate       AM-PAC OT "6 Clicks" Daily Activity     Outcome Measure   Help from another person eating meals?: Total Help from another person taking care of personal grooming?: Total Help from another person toileting, which includes using toliet, bedpan, or urinal?: Total Help from another person bathing (including washing, rinsing, drying)?: Total Help from another person to put on and taking off regular upper body clothing?: Total Help from another person to put on and taking off regular lower body clothing?: Total 6 Click Score: 6    End of Session    OT Visit Diagnosis: Muscle weakness (generalized) (M62.81);Cognitive communication deficit (R41.841);Other symptoms and signs involving cognitive function;Adult, failure to thrive (R62.7)   Activity Tolerance Patient limited by fatigue   Patient Left in bed;with call bell/phone within reach;with bed alarm set;with family/visitor present   Nurse Communication          Time: [id number] OT Time Calculation (min): 12 min  Charges: OT General Charges $OT Visit: 1 Visit OT Treatments $Self Care/Home Management : 8-22 mins    Harden Bramer P, MS, OTR/L 10/08/2018, 12:15 PM

## 2018-11-18 DEATH — deceased

## 2019-03-04 IMAGING — MR MR HEAD W/O CM
9 of 10 series · 37 of 48 positions shown · non-contrast
Comparison: Head CT 10/01/2018

CLINICAL DATA: Altered mental status. Dementia. Possible stroke.
Unsteady gait and slurred speech.

EXAM:
MRI HEAD WITHOUT CONTRAST
TECHNIQUE: Multiplanar, multiecho pulse sequences of the brain and surrounding
structures were obtained without intravenous contrast.

[Series 3: DWI · axial · 3.0mm · 0.94mm/px · z∈[-20,+119]mm · 8 of 100 slices shown (1 of 2)]
[im 1/100]
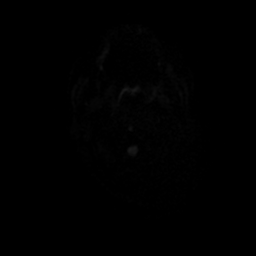
[im 12/100]
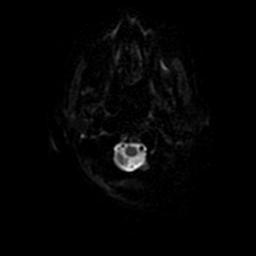
[im 34/100]
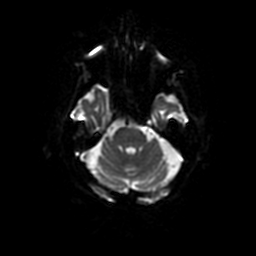
[im 45/100]
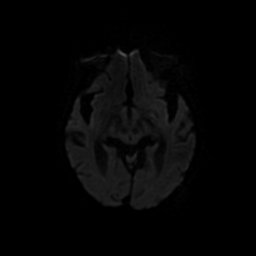
[im 56/100]
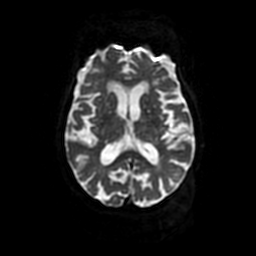
[im 67/100]
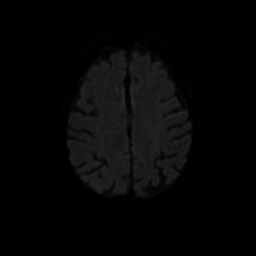
[im 89/100]
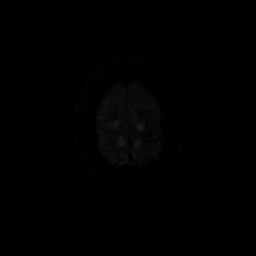
[im 100/100]
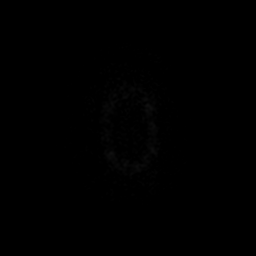

[Series 4: DWI · coronal · 4.0mm · 0.94mm/px · 7 of 72 slices shown (2 of 2)]
[im 1/72]
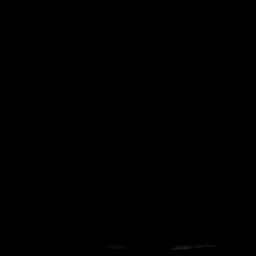
[im 12/72]
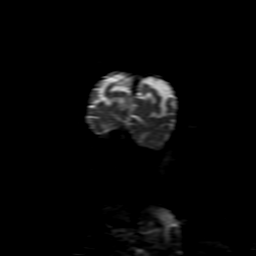
[im 24/72]
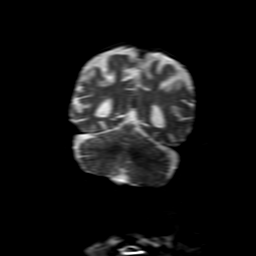
[im 36/72]
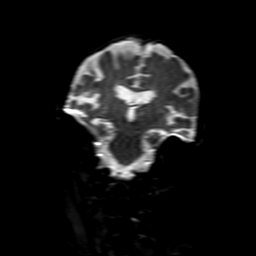
[im 48/72]
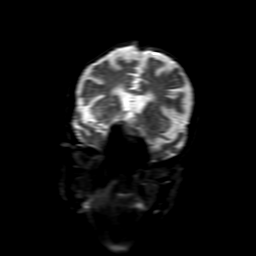
[im 60/72]
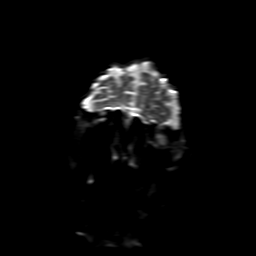
[im 72/72]
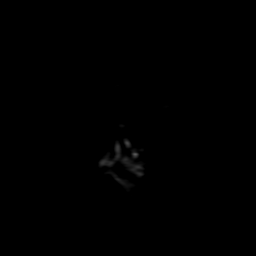

[Series 5: FLAIR · sagittal · 5.0mm · 0.47mm/px · 2 of 23 slices shown (1 of 2)]
[im 1/23]
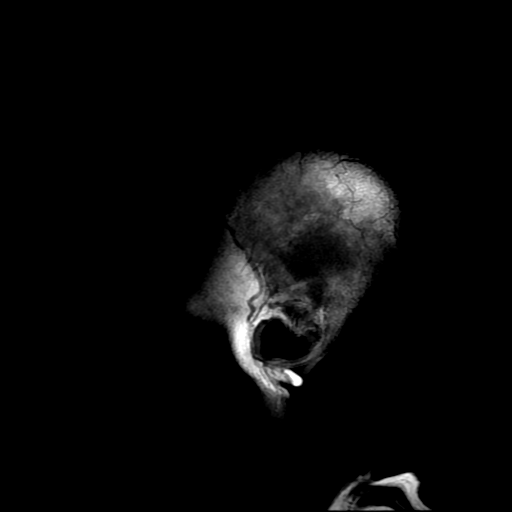
[im 23/23]
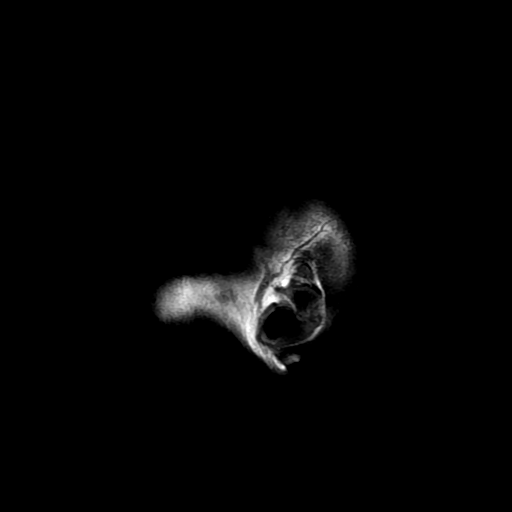

[Series 7: T2 · axial · 5.0mm · 0.47mm/px · z∈[-18,+118]mm · 2 of 25 slices shown (1 of 2)]
[im 1/25]
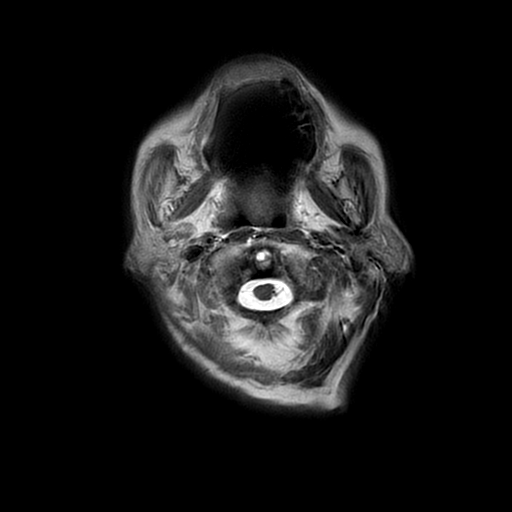
[im 25/25]
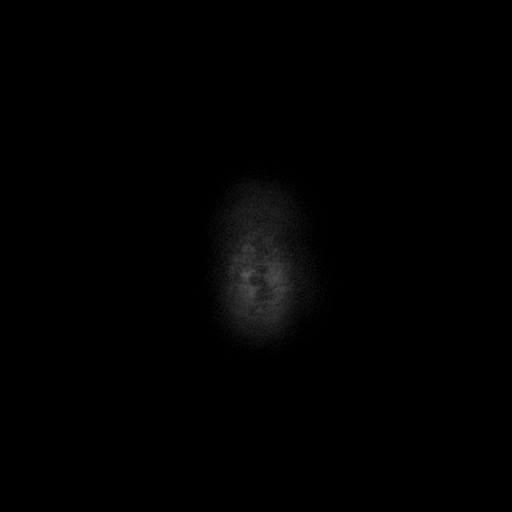

[Series 8: FLAIR · axial · 5.0mm · 0.47mm/px · z∈[-18,+118]mm · 2 of 25 slices shown (2 of 2)]
[im 1/25]
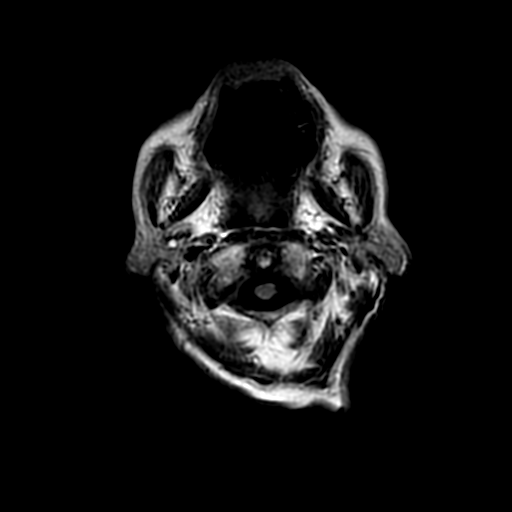
[im 25/25]
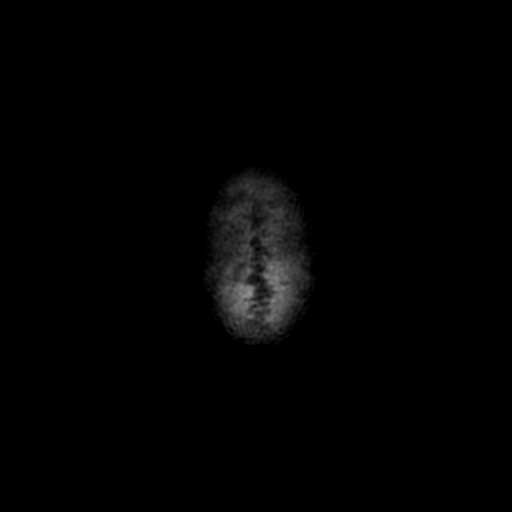

[Series 9: (person_name) · axial · 3.0mm · 0.47mm/px · z∈[-20,+67]mm · 5 of 100 slices shown]
[im 1/100]
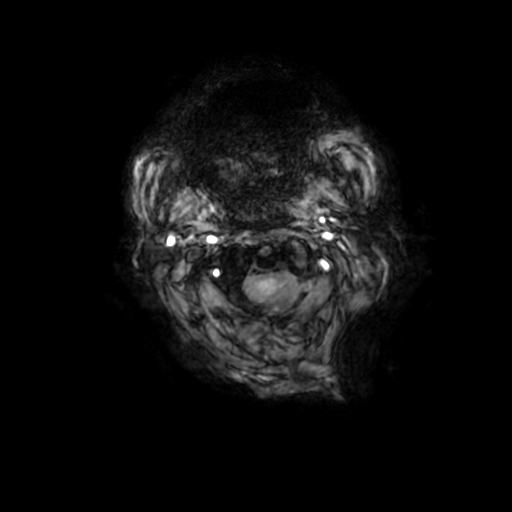
[im 13/100]
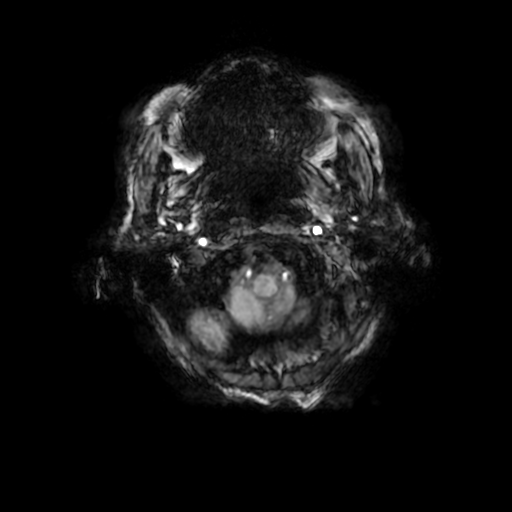
[im 25/100]
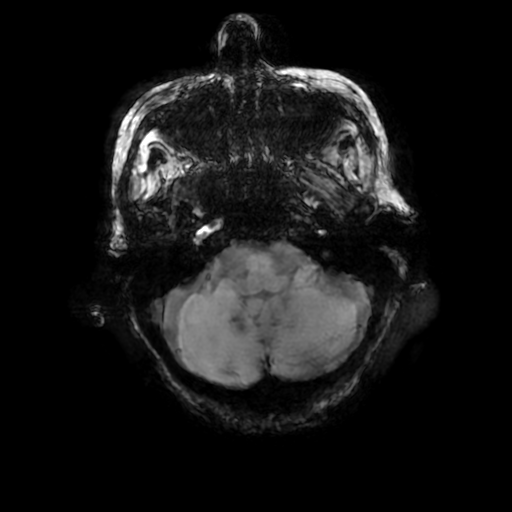
[im 38/100]
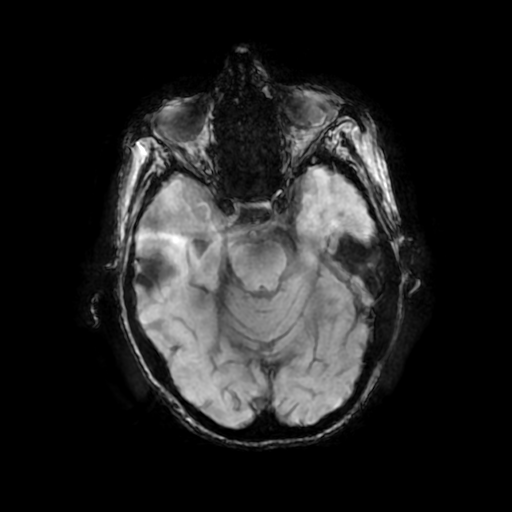
[im 62/100]
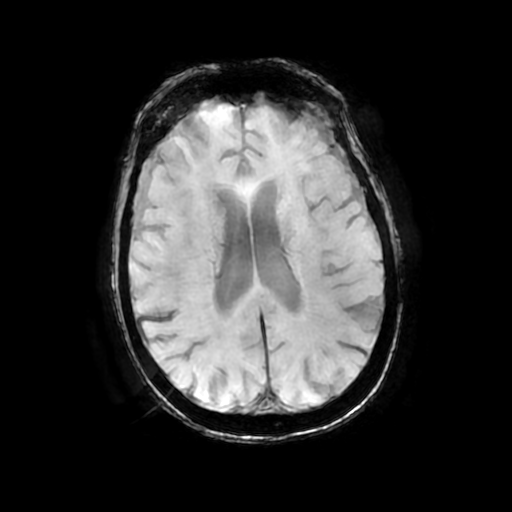

[Series 11: T2 · coronal · 5.0mm · 0.94mm/px · 3 of 30 slices shown (2 of 2)]
[im 1/30]
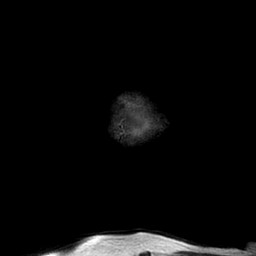
[im 15/30]
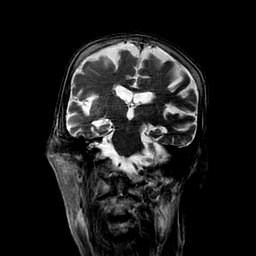
[im 30/30]
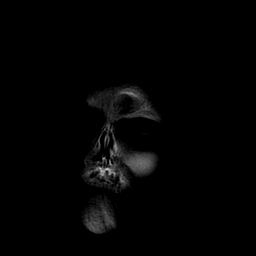

[Series 350: ADC · axial · 3.0mm · 0.94mm/px · z∈[-20,+119]mm · 5 of 50 slices shown (1 of 2)]
[im 1/50]
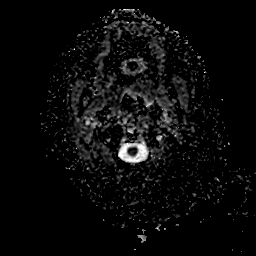
[im 13/50]
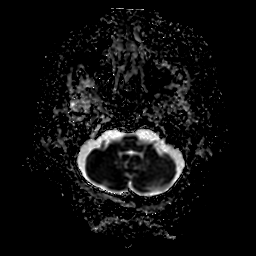
[im 25/50]
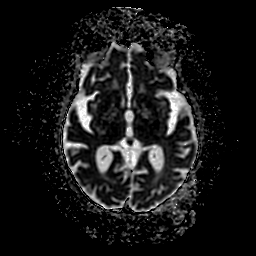
[im 37/50]
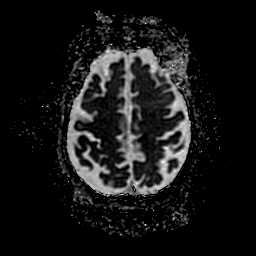
[im 50/50]
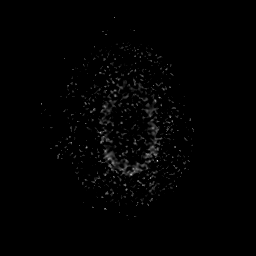

[Series 450: ADC · coronal · 4.0mm · 0.94mm/px · 3 of 36 slices shown (2 of 2)]
[im 1/36]
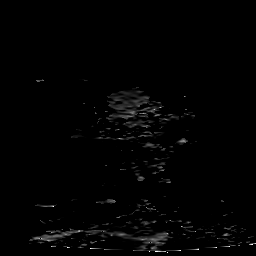
[im 18/36]
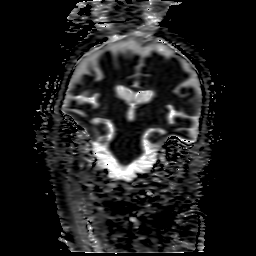
[im 36/36]
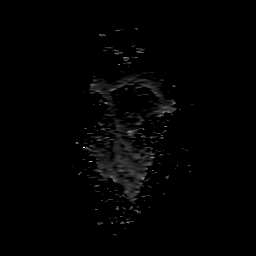

[37 of 48 positions shown; findings below may reference images not displayed]

FINDINGS: Brain: Diffusion imaging does not show any acute or subacute
infarction. The brainstem and cerebellum are normal for age.
Cerebral hemispheres show generalized atrophy with chronic
small-vessel ischemic changes of the deep white matter. No cortical
or large vessel territory infarction. No mass lesion, hemorrhage,
hydrocephalus or extra-axial collection.

Vascular: Major vessels at the base of the brain show flow.

Skull and upper cervical spine: Negative

Sinuses/Orbits: Clear/normal

Other: None
IMPRESSION: No acute or subacute insult. Chronic small-vessel ischemic changes
throughout the cerebral hemispheric white matter. Age related
atrophy. Right frontal findings at CT questioned previously do not
represent infarction.
